# Patient Record
Sex: Male | Born: 1960 | Race: White | Hispanic: No | Marital: Married | Smoking: Never smoker
Health system: Southern US, Community
[De-identification: ages and names within clinical notes are randomized; demographics above are authoritative.]

## PROBLEM LIST (undated history)

## (undated) DIAGNOSIS — G4700 Insomnia, unspecified: Secondary | ICD-10-CM

## (undated) DIAGNOSIS — M2550 Pain in unspecified joint: Secondary | ICD-10-CM

## (undated) DIAGNOSIS — Z8639 Personal history of other endocrine, nutritional and metabolic disease: Secondary | ICD-10-CM

## (undated) DIAGNOSIS — F429 Obsessive-compulsive disorder, unspecified: Secondary | ICD-10-CM

## (undated) DIAGNOSIS — H5210 Myopia, unspecified eye: Secondary | ICD-10-CM

## (undated) DIAGNOSIS — E079 Disorder of thyroid, unspecified: Secondary | ICD-10-CM

## (undated) HISTORY — DX: Pain in unspecified joint: M25.50

## (undated) HISTORY — DX: Myopia, unspecified eye: H52.10

## (undated) HISTORY — DX: Obsessive-compulsive disorder, unspecified: F42.9

## (undated) HISTORY — DX: Personal history of other endocrine, nutritional and metabolic disease: Z86.39

## (undated) HISTORY — DX: Insomnia, unspecified: G47.00

---

## 2007-06-13 ENCOUNTER — Ambulatory Visit: Payer: Self-pay | Admitting: Family Medicine

## 2010-06-26 ENCOUNTER — Encounter
Admission: RE | Admit: 2010-06-26 | Discharge: 2010-06-26 | Payer: Self-pay | Source: Home / Self Care | Attending: Family Medicine | Admitting: Family Medicine

## 2010-06-26 ENCOUNTER — Ambulatory Visit
Admission: RE | Admit: 2010-06-26 | Discharge: 2010-06-26 | Payer: Self-pay | Source: Home / Self Care | Attending: Family Medicine | Admitting: Family Medicine

## 2010-11-27 ENCOUNTER — Encounter: Payer: Self-pay | Admitting: Medical

## 2010-11-27 ENCOUNTER — Ambulatory Visit (INDEPENDENT_AMBULATORY_CARE_PROVIDER_SITE_OTHER): Payer: BC Managed Care – PPO | Admitting: Medical

## 2010-11-27 VITALS — BP 128/88 | HR 80 | Temp 98.5°F | Ht 70.0 in | Wt 236.0 lb

## 2010-11-27 DIAGNOSIS — J40 Bronchitis, not specified as acute or chronic: Secondary | ICD-10-CM

## 2010-11-27 MED ORDER — AZITHROMYCIN 500 MG PO TABS: 500.0000 mg | ORAL_TABLET | Freq: Every day | ORAL | Status: AC

## 2010-11-27 NOTE — Patient Instructions (Signed)
Acute Bronchitis Bronchitis is a problem of the air tubes leading to your lungs. Acute means the illness started quickly. In this condition, the lining of those tubes becomes puffy (swollen) and can leak fluid. This makes it harder for air to get in and out of your lungs. You may cough a lot. This is because the air tubes are narrow. Bronchitis is most often caused by a virus. Medicines that kill germs (antibiotics) may be needed with germ (bacteria) infections for people who:  Smoke.   Have lasting (chronic) lung problems.   Are elderly.  HOME CARE  Rest.   Drink enough water and fluids to keep the pee clear or pale yellow.   Only take medicine as told by your doctor.   Medicines may be prescribed that will open up the airways. This will help make breathing easier.   Bronchitis usually gets better on its own in a few days.  Recovery from some problems (symptoms) of bronchitis may be slow. You should start feeling a little better after 2 to 3 days. Coughing may last for 3 to 4 weeks. GET HELP RIGHT AWAY IF:  You or your child has a temperature by mouth above 101, not controlled by medicine.   Chills or chest pain develops.   You or your child develops very bad shortness of breath.   There is bloody saliva mixed with mucus (sputum).   You or your child throws up (vomits) often, loses too much fluid (dehydration), feels faint, or has a very bad headache.   You or your child does not improve after 1 week of treatment.  MAKE SURE YOU:   Understand these instructions.   Will watch this condition.   Will get help right away if you or your child is not doing well or gets worse.  Document Released: 11/24/2007 Document Re-Released: 09/01/2009 [employer] Patient Information 2011 [employer].  

## 2010-11-27 NOTE — Progress Notes (Signed)
Subjective:     Bill Kennedy is a 50 y.o. male who presents for evaluation of eye irritation, nasal congestion, productive cough and sore throat.  Onset of symptoms was 4 days ago, and has been gradually worsening since that time. Treatment to date: Advil.  They  note hx/o bronchitis in the past.  Denies sick contacts.  No other aggravating or relieving factors.  He is a nonsmoker.    He is treated by Dr. Evelene Croon for OCD, but has ongoing issues with sleep.  Will sleep for 15 hours one day, and can go 3 days without sleep.  Has discussed this with Dr. Evelene Croon.  Denies hx/o bipolar or narcolepsy.  Wants to come in soon for physical.   The following portions of the patient's history were reviewed and updated as appropriate: allergies, current medications, past family history, past medical history, past social history, past surgical history and problem list.  Review of Systems Constitutional: Denies fever, chills, sweats, anorexia Skin: denies rash Cardiovascular: denies chest pain, palpitations Lungs: +productive sputum; denies wheezing, hemoptysis, orthopnea, PND Abdomen: denies abdominal pain, nausea, vomiting, diarrhea GU: denies dysuria Extremities: denies edema, myalgias, arthralgias  Objective:   Filed Vitals:   11/27/10 1353  BP: 128/88  Pulse: 80  Temp: 98.5 F (36.9 C)    General appearance: Alert, WD/WN, no distress, ill appearing                             Skin: warm, no rash, no diaphoresis                           Head: no sinus tenderness                            Eyes: conjunctiva normal, corneas clear, PERRLA                            Ears: pearly TMs, external ear canals normal                          Nose: septum midline, turbinates swollen, with erythema and clear discharge             Mouth/throat: MMM, tongue normal, mild pharyngeal erythema                           Neck: supple, no adenopathy, no thyromegaly, non tender                          Heart: RRR,  normal S1, S2, no murmurs                         Lungs: +bronchial breath sounds, +scattered rhonchi, no wheezes, no rales                Extremities: no edema, nontender     Assessment:   Encounter Diagnosis  Name Primary?  . Bronchitis Yes     Plan:   Prescription given today for Azithromycin as below.  Discussed diagnosis and treatment of bronchitis.  Suggested symptomatic OTC remedies for cough and congestion.  Nasal saline spray for nasal congestion.  Tylenol or Ibuprofen OTC for fever and malaise.  Call/return in 2-3 days if symptoms are worse or not improving.  Advised that cough may linger even after the infection is improved.     Return soon for physical, labs, and to further discuss his sleep and concerns.

## 2011-06-10 ENCOUNTER — Ambulatory Visit: Payer: BC Managed Care – PPO | Admitting: Family Medicine

## 2011-06-11 ENCOUNTER — Ambulatory Visit (INDEPENDENT_AMBULATORY_CARE_PROVIDER_SITE_OTHER): Payer: BC Managed Care – PPO | Admitting: Family Medicine

## 2011-06-11 VITALS — BP 120/88 | HR 89 | Temp 98.6°F | Wt 250.0 lb

## 2011-06-11 DIAGNOSIS — J069 Acute upper respiratory infection, unspecified: Secondary | ICD-10-CM

## 2011-06-11 NOTE — Progress Notes (Signed)
  Subjective:    Patient ID: Bill Kennedy, male    DOB: 06-Feb-1961, 50 y.o.   MRN: [id number]  HPI Three days ago he started having difficulty with runny nose, chest congestion, headache with sinus pressure, fatigue with PND,dry cough.   Review of Systems     Objective:   Physical Exam alert and in no distress. Tympanic membranes and canals are normal. Throat is clear. Tonsils are normal. Neck is supple without adenopathy or thyromegaly. Cardiac exam shows a regular sinus rhythm without murmurs or gallops. Lungs are clear to auscultation.        Assessment & Plan:  URI Supportive care. Use Afrin at night to help with breathing through the nose. If not improving by Monday call for possible antibiotic.

## 2011-06-11 NOTE — Patient Instructions (Signed)
Use Robitussin-DM or try NyQuil at night. Also use for nighttime. If he still has symptoms or U. getting worse by Monday call back

## 2011-06-17 ENCOUNTER — Telehealth: Payer: Self-pay | Admitting: Family Medicine

## 2011-06-17 MED ORDER — AMOXICILLIN 875 MG PO TABS: 875.0000 mg | ORAL_TABLET | Freq: Two times a day (BID) | ORAL | Status: AC

## 2011-06-17 NOTE — Telephone Encounter (Signed)
Amoxil called in to treat ongoing difficulty with bronchitis

## 2011-06-17 NOTE — Telephone Encounter (Signed)
Pt called and stated that in some ways he is better and other ways worse.  Pt states cough is worse and just to note he was coughing rather badly while on the phone.  States he is now coughing up stuff and has sinus drainage.  Pt uses [facility] on [address].

## 2011-06-17 NOTE — Telephone Encounter (Signed)
Call the  patient and let him know I called in an antibiotic in

## 2011-06-18 NOTE — Telephone Encounter (Signed)
Bill Kennedy spoke with pt this morning and advised Amoxil was called in.

## 2011-07-20 ENCOUNTER — Encounter: Payer: Self-pay | Admitting: Medical

## 2011-07-20 ENCOUNTER — Ambulatory Visit (INDEPENDENT_AMBULATORY_CARE_PROVIDER_SITE_OTHER): Payer: BC Managed Care – PPO | Admitting: Medical

## 2011-07-20 VITALS — BP 130/82 | HR 78 | Temp 98.3°F | Resp 18 | Ht 69.0 in | Wt 243.0 lb

## 2011-07-20 DIAGNOSIS — Z125 Encounter for screening for malignant neoplasm of prostate: Secondary | ICD-10-CM

## 2011-07-20 DIAGNOSIS — R5382 Chronic fatigue, unspecified: Secondary | ICD-10-CM | POA: Insufficient documentation

## 2011-07-20 DIAGNOSIS — Z1211 Encounter for screening for malignant neoplasm of colon: Secondary | ICD-10-CM

## 2011-07-20 DIAGNOSIS — R5381 Other malaise: Secondary | ICD-10-CM

## 2011-07-20 DIAGNOSIS — L570 Actinic keratosis: Secondary | ICD-10-CM

## 2011-07-20 DIAGNOSIS — R5383 Other fatigue: Secondary | ICD-10-CM

## 2011-07-20 DIAGNOSIS — Z23 Encounter for immunization: Secondary | ICD-10-CM

## 2011-07-20 DIAGNOSIS — Z0000 Encounter for general adult medical examination without abnormal findings: Secondary | ICD-10-CM

## 2011-07-20 DIAGNOSIS — R6882 Decreased libido: Secondary | ICD-10-CM

## 2011-07-20 LAB — POCT URINALYSIS DIPSTICK
Blood, UA: NEGATIVE
Glucose, UA: NEGATIVE
Protein, UA: NEGATIVE
Spec Grav, UA: <=1.005
Urobilinogen, UA: NEGATIVE
pH, UA: 6

## 2011-07-20 NOTE — Progress Notes (Signed)
Subjective:   HPI  Bill Kennedy is a 51 y.o. male who presents for a complete physical.  He is fasting today.  He does have some recent concerns. Lately he has just been feeling fatigued all the time, no energy. He has some joint pains in her shoulders, has a history of injuries in the past.  He is curious about having his hormones checked and some labs given the fatigue.  He is followed by psychiatry, Dr. Evelene Croon and Wellbutrin was added on about a week ago.  He notes that he does snore some, but no history of apnea, and has never been tested for sleep apnea.  Last eye doctor visit last year, last dental visit about 6months ago.   He did not get flu shot this past year. Last tetanus probably more than 10 years ago.  Denies prior colonoscopy.    Reviewed their medical, surgical, family, social, medication, and allergy history and updated chart as appropriate.    Past Medical History  Diagnosis Date  . OCD (obsessive compulsive disorder)     Dr. Evelene Croon  . Insomnia   . Personal history of goiter   . Joint pain     hx/o bone spur of right AC joint  . Nearsightedness     wears glasses  . Hematuria     hospitalization in remote past, etiology unknown, and symptoms cleared    History reviewed. No pertinent past surgical history.  Family History  Problem Relation Age of Onset  . Diabetes Mother   . Macular degeneration Mother   . Hypertension Mother   . Other Father     unknown  . Anxiety disorder Sister   . Anxiety disorder Brother   . Heart disease Neg Hx   . Cancer Neg Hx   . Stroke Neg Hx   . Hyperlipidemia Neg Hx     History   Social History  . Marital Status: Married    Spouse Name: N/A    Number of Children: N/A  . Years of Education: N/A   Occupational History  . manual body therapy    Social History Main Topics  . Smoking status: Never Smoker   . Smokeless tobacco: Never Used  . Alcohol Use: 0.5 oz/week    1 drink(s) per week  . Drug Use: No  . Sexually  Active: Not on file   Other Topics Concern  . Not on file   Social History Narrative   Married, 2 children, exercise - rides mountain bike, yoga    Current Outpatient Prescriptions on File Prior to Visit  Medication Sig Dispense Refill  . ALPRAZolam (XANAX) 0.5 MG tablet Take 0.5 mg by mouth at bedtime as needed.        . Fluvoxamine Maleate (LUVOX CR) 150 MG CP24 Take 1 capsule by mouth daily.        . Zolpidem Tartrate (AMBIEN PO) Take by mouth.          No Known Allergies  Review of Systems Constitutional: -fever, -chills, -sweats, -unexpected weight change, -anorexia, +fatigue Allergy: -sneezing, -itching, -congestion Dermatology: denies changing moles, rash, lumps, new worrisome lesions ENT: -runny nose, -ear pain, -sore throat, -hoarseness, -sinus pain, -teeth pain, -tinnitus, -hearing loss, -epistaxis Cardiology:  -chest pain, -palpitations, -edema, -orthopnea, -paroxysmal nocturnal dyspnea Respiratory: -cough, -shortness of breath, -dyspnea on exertion, -wheezing, -hemoptysis Gastroenterology: -abdominal pain, -nausea, -vomiting, -diarrhea, -constipation, -blood in stool, -changes in bowel movement, -dysphagia Hematology: -bleeding or bruising problems Musculoskeletal: -arthralgias, -myalgias, -joint swelling, -back  pain, +neck pain, -cramping, -gait changes Ophthalmology: -vision changes, -eye redness, -itching, -discharge Urology: -dysuria, -difficulty urinating, -hematuria, -urinary frequency, -urgency, incontinence Neurology: -headache, -weakness, -tingling, -numbness, -speech abnormality, -memory loss, -falls, -dizziness Psychology:  -depressed mood, -agitation, +sleep problems     Objective:   Physical Exam  Filed Vitals:   07/20/11 0937  BP: 130/82  Pulse: 78  Temp: 98.3 F (36.8 C)  Resp: 18    General appearance: alert, no distress, WD/WN, white male, overweight Skin: Upper middle chest with 2 mm raised crusted hard lesion slightly inflamed, several  scattered flesh-colored slightly raised crusting lesions on right arm, left anterior leg, all suggestive of AKs, left upper back with oval somewhat elongated 2 mm 4 mm brown and somewhat crusted flesh-colored lesion , other scattered benign appearing lesions HEENT: normocephalic, conjunctiva/corneas normal, sclerae anicteric, PERRLA, EOMi, nares patent, no discharge or erythema, pharynx normal Oral cavity: MMM, tongue normal, teeth in good repair Neck: supple, no lymphadenopathy, no thyromegaly, no masses, normal ROM, no bruits Chest: non tender, normal shape and expansion Heart: RRR, normal S1, S2, no murmurs Lungs: CTA bilaterally, no wheezes, rhonchi, or rales Abdomen: +bs, soft, non tender, non distended, no masses, no hepatomegaly, no splenomegaly, no bruits Back: non tender, normal ROM, no scoliosis Musculoskeletal: upper extremities non tender, no obvious deformity, normal ROM throughout, lower extremities non tender, no obvious deformity, normal ROM throughout Extremities: no edema, no cyanosis, no clubbing Pulses: 2+ symmetric, upper and lower extremities, normal cap refill Neurological: alert, oriented x 3, CN2-12 intact, strength normal upper extremities and lower extremities, sensation normal throughout, DTRs 2+ throughout, no cerebellar signs, gait normal Psychiatric: normal affect, behavior normal, pleasant  GU: normal male external genitalia, nontender, no masses, no hernia, no lymphadenopathy Rectal: Anus normal, prostate smooth, no nodules, guaiac negative, no obvious hemorrhoids   Assessment and Plan :    Encounter Diagnoses  Name Primary?  . Routine general medical examination at a health care facility Yes  . Fatigue   . Libido, decreased   . Screen for colon cancer   . Screening for prostate cancer   . Actinic keratoses   . Need for Tdap vaccination     Physical exam - discussed healthy lifestyle, diet, exercise, preventative care, vaccinations, and addressed their  concerns.  We had discussed him having a Tdap booster today but he left before we had a chance to give the vaccine.  Labs today to further evaluate fatigue and libido.  We'll refer for first screening colonoscopy with Dr. Elnoria Howard.  PSA screening today   AKs - advised dermatology referral for further eval and management.  Follow-up pending labs.

## 2011-07-20 NOTE — Patient Instructions (Signed)
Preventative Care for Adults, Male       REGULAR HEALTH EXAMS:  A routine yearly physical is a good way to check in with your primary care provider about your health and preventive screening. It is also an opportunity to share updates about your health and any concerns you have, and receive a thorough all-over exam.   Most health insurance companies pay for at least some preventative services.  Check with your health plan for specific coverages.  WHAT PREVENTATIVE SERVICES DO MEN NEED?  Adult men should have their weight and blood pressure checked regularly.   Men age 35 and older should have their cholesterol levels checked regularly.  Beginning at age 50 and continuing to age 75, men should be screened for colorectal cancer.  Certain people should may need continued testing until age 85.  Other cancer screening may include exams for testicular and prostate cancer.  Updating vaccinations is part of preventative care.  Vaccinations help protect against diseases such as the flu.  Lab tests are generally done as part of preventative care to screen for anemia and blood disorders, to screen for problems with the kidneys and liver, to screen for bladder problems, to check blood sugar, and to check your cholesterol level.  Preventative services generally include counseling about diet, exercise, avoiding tobacco, drugs, excessive alcohol consumption, and sexually transmitted infections.    GENERAL RECOMMENDATIONS FOR GOOD HEALTH:  Healthy diet:  Eat a variety of foods, including fruit, vegetables, animal or vegetable protein, such as meat, fish, chicken, and eggs, or beans, lentils, tofu, and grains, such as rice.  Drink plenty of water daily.  Decrease saturated fat in the diet, avoid lots of red meat, processed foods, sweets, fast foods, and fried foods.  Exercise:  Aerobic exercise helps maintain good heart health. At least 30-40 minutes of moderate-intensity exercise is recommended.  For example, a brisk walk that increases your heart rate and breathing. This should be done on most days of the week.   Find a type of exercise or a variety of exercises that you enjoy so that it becomes a part of your daily life.  Examples are running, walking, swimming, water aerobics, and biking.  For motivation and support, explore group exercise such as aerobic class, spin class, Zumba, Yoga,or  martial arts, etc.    Set exercise goals for yourself, such as a certain weight goal, walk or run in a race such as a 5k walk/run.  Speak to your primary care provider about exercise goals.  Disease prevention:  If you smoke or chew tobacco, find out from your caregiver how to quit. It can literally save your life, no matter how long you have been a tobacco user. If you do not use tobacco, never begin.   Maintain a healthy diet and normal weight. Increased weight leads to problems with blood pressure and diabetes.   The Body Mass Index or BMI is a way of measuring how much of your body is fat. Having a BMI above 27 increases the risk of heart disease, diabetes, hypertension, stroke and other problems related to obesity. Your caregiver can help determine your BMI and based on it develop an exercise and dietary program to help you achieve or maintain this important measurement at a healthful level.  High blood pressure causes heart and blood vessel problems.  Persistent high blood pressure should be treated with medicine if weight loss and exercise do not work.   Fat and cholesterol leaves deposits in your arteries   that can block them. This causes heart disease and vessel disease elsewhere in your body.  If your cholesterol is found to be high, or if you have heart disease or certain other medical conditions, then you may need to have your cholesterol monitored frequently and be treated with medication.   Ask if you should have a stress test if your history suggests this. A stress test is a test done on  a treadmill that looks for heart disease. This test can find disease prior to there being a problem.  Avoid drinking alcohol in excess (more than two drinks per day).  Avoid use of street drugs. Do not share needles with anyone. Ask for professional help if you need assistance or instructions on stopping the use of alcohol, cigarettes, and/or drugs.  Brush your teeth twice a day with fluoride toothpaste, and floss once a day. Good oral hygiene prevents tooth decay and gum disease. The problems can be painful, unattractive, and can cause other health problems. Visit your dentist for a routine oral and dental check up and preventive care every 6-12 months.   Look at your skin regularly.  Use a mirror to look at your back. Notify your caregivers of changes in moles, especially if there are changes in shapes, colors, a size larger than a pencil eraser, an irregular border, or development of new moles.  Safety:  Use seatbelts 100% of the time, whether driving or as a passenger.  Use safety devices such as hearing protection if you work in environments with loud noise or significant background noise.  Use safety glasses when doing any work that could send debris in to the eyes.  Use a helmet if you ride a bike or motorcycle.  Use appropriate safety gear for contact sports.  Talk to your caregiver about gun safety.  Use sunscreen with a SPF (or skin protection factor) of 15 or greater.  Lighter skinned people are at a greater risk of skin cancer. Don't forget to also wear sunglasses in order to protect your eyes from too much damaging sunlight. Damaging sunlight can accelerate cataract formation.   Practice safe sex. Use condoms. Condoms are used for birth control and to help reduce the spread of sexually transmitted infections (or STIs).  Some of the STIs are gonorrhea (the clap), chlamydia, syphilis, trichomonas, herpes, HPV (human papilloma virus) and HIV (human immunodeficiency virus) which causes AIDS.  The herpes, HIV and HPV are viral illnesses that have no cure. These can result in disability, cancer and death.   Keep carbon monoxide and smoke detectors in your home functioning at all times. Change the batteries every 6 months or use a model that plugs into the wall.   Vaccinations:  Stay up to date with your tetanus shots and other required immunizations. You should have a booster for tetanus every 10 years. Be sure to get your flu shot every year, since 5%-20% of the [place] population comes down with the flu. The flu vaccine changes each year, so being vaccinated once is not enough. Get your shot in the fall, before the flu season peaks.   Other vaccines to consider:  Pneumococcal vaccine to protect against certain types of pneumonia.  This is normally recommended for adults age 65 or older.  However, adults younger than 51 years old with certain underlying conditions such as diabetes, heart or lung disease should also receive the vaccine.  Shingles vaccine to protect against Varicella Zoster if you are older than age 60, or younger   than 51 years old with certain underlying illness.  Hepatitis A vaccine to protect against a form of infection of the liver by a virus acquired from food.  Hepatitis B vaccine to protect against a form of infection of the liver by a virus acquired from blood or body fluids, particularly if you work in health care.  If you plan to travel internationally, check with your local health department for specific vaccination recommendations.  Cancer Screening:  Most routine colon cancer screening begins at the age of 50. On a yearly basis, doctors may provide special easy to use take-home tests to check for hidden blood in the stool. Sigmoidoscopy or colonoscopy can detect the earliest forms of colon cancer and is life saving. These tests use a small camera at the end of a tube to directly examine the colon. Speak to your caregiver about this at age 50, when routine  screening begins (and is repeated every 5 years unless early forms of pre-cancerous polyps or small growths are found).   At the age of 50 men usually start screening for prostate cancer every year. Screening may begin at a younger age for those with higher risk. Those at higher risk include African-Americans or having a family history of prostate cancer. There are two types of tests for prostate cancer:   Prostate-specific antigen (PSA) testing. Recent studies raise questions about prostate cancer using PSA and you should discuss this with your caregiver.   Digital rectal exam (in which your doctor's lubricated and gloved finger feels for enlargement of the prostate through the anus).   Screening for testicular cancer.  Do a monthly exam of your testicles. Gently roll each testicle between your thumb and fingers, feeling for any abnormal lumps. The best time to do this is after a hot shower or bath when the tissues are looser. Notify your caregivers of any lumps, tenderness or changes in size or shape immediately.     

## 2011-07-21 ENCOUNTER — Encounter: Payer: Self-pay | Admitting: Medical

## 2011-07-21 LAB — CBC WITH DIFFERENTIAL/PLATELET
HCT: 48.4 % (ref 39.0–52.0)
Hemoglobin: 16.6 g/dL (ref 13.0–17.0)
Lymphs Abs: 1.7 10*3/uL (ref 0.7–4.0)
MCH: 31.4 pg (ref 26.0–34.0)
Monocytes Relative: 7 % (ref 3–12)
Neutro Abs: 5.2 10*3/uL (ref 1.7–7.7)
Neutrophils Relative %: 69 % (ref 43–77)
RBC: 5.29 MIL/uL (ref 4.22–5.81)

## 2011-07-21 LAB — COMPREHENSIVE METABOLIC PANEL
Albumin: 5 g/dL (ref 3.5–5.2)
Alkaline Phosphatase: 59 U/L (ref 39–117)
BUN: 12 mg/dL (ref 6–23)
Calcium: 10 mg/dL (ref 8.4–10.5)
Creat: 1 mg/dL (ref 0.50–1.35)
Glucose, Bld: 101 mg/dL — ABNORMAL HIGH (ref 70–99)
Potassium: 4 mEq/L (ref 3.5–5.3)

## 2011-07-21 LAB — LIPID PANEL
Cholesterol: 239 mg/dL — ABNORMAL HIGH (ref 0–200)
Total CHOL/HDL Ratio: 6.8 Ratio
Triglycerides: 274 mg/dL — ABNORMAL HIGH (ref <150)
VLDL: 55 mg/dL — ABNORMAL HIGH (ref 0–40)

## 2018-10-13 ENCOUNTER — Other Ambulatory Visit (INDEPENDENT_AMBULATORY_CARE_PROVIDER_SITE_OTHER): Payer: Self-pay

## 2018-10-13 ENCOUNTER — Ambulatory Visit (INDEPENDENT_AMBULATORY_CARE_PROVIDER_SITE_OTHER): Payer: Self-pay

## 2018-10-13 ENCOUNTER — Encounter (INDEPENDENT_AMBULATORY_CARE_PROVIDER_SITE_OTHER): Payer: Self-pay | Admitting: Family Medicine

## 2018-10-13 ENCOUNTER — Ambulatory Visit (INDEPENDENT_AMBULATORY_CARE_PROVIDER_SITE_OTHER): Payer: BLUE CROSS/BLUE SHIELD | Admitting: Family Medicine

## 2018-10-13 ENCOUNTER — Other Ambulatory Visit: Payer: Self-pay

## 2018-10-13 VITALS — BP 127/79 | HR 73 | Temp 98.6°F | Ht 70.0 in | Wt 235.9 lb

## 2018-10-13 DIAGNOSIS — M25561 Pain in right knee: Secondary | ICD-10-CM | POA: Diagnosis not present

## 2018-10-13 DIAGNOSIS — G8929 Other chronic pain: Secondary | ICD-10-CM | POA: Insufficient documentation

## 2018-10-13 DIAGNOSIS — M4306 Spondylolysis, lumbar region: Secondary | ICD-10-CM | POA: Diagnosis not present

## 2018-10-13 DIAGNOSIS — E8881 Metabolic syndrome: Secondary | ICD-10-CM

## 2018-10-13 DIAGNOSIS — E349 Endocrine disorder, unspecified: Secondary | ICD-10-CM

## 2018-10-13 DIAGNOSIS — M25511 Pain in right shoulder: Secondary | ICD-10-CM

## 2018-10-13 DIAGNOSIS — M25512 Pain in left shoulder: Secondary | ICD-10-CM

## 2018-10-13 DIAGNOSIS — M25562 Pain in left knee: Secondary | ICD-10-CM

## 2018-10-13 DIAGNOSIS — Z720 Tobacco use: Secondary | ICD-10-CM

## 2018-10-13 DIAGNOSIS — E291 Testicular hypofunction: Secondary | ICD-10-CM

## 2018-10-13 DIAGNOSIS — R5382 Chronic fatigue, unspecified: Secondary | ICD-10-CM | POA: Diagnosis not present

## 2018-10-13 DIAGNOSIS — E039 Hypothyroidism, unspecified: Secondary | ICD-10-CM

## 2018-10-13 DIAGNOSIS — E785 Hyperlipidemia, unspecified: Secondary | ICD-10-CM | POA: Insufficient documentation

## 2018-10-13 DIAGNOSIS — E669 Obesity, unspecified: Secondary | ICD-10-CM | POA: Insufficient documentation

## 2018-10-13 NOTE — Progress Notes (Signed)
Office Visit Note   Patient: Bill Kennedy           Date of Birth: Nov 20, 1960           MRN: [id number] Visit Date: 10/13/2018 Requested by: Ronnald Nian, MD [address] [place], [place] [address] PCP: Lavada Mesi, MD  Subjective: Chief Complaint  Patient presents with  . establish primary care    HPI: He is a 58 year old here to establish care.  He was referred by 1 of my patients.  He has multiple issues to discuss today.  In his 84s he started having troubles with fatigue and multiple joint aches and pains.  He was in the [employer] and wonders whether his symptoms might have started after getting a bunch of vaccines.  He has gotten steadily worse over the years and he eventually sought treatment from a naturopathic doctor in [place].  This finally helped him quite a bit.  That doctor unfortunately passed away, and he has been seeing another provider in [facility].  Labs a few years ago showed low testosterone which is now being treated with injectable testosterone 50 mg every week.  He takes Arimedex for elevated estradiol and periodically gets phlebotomy treatments when needed.  This regimen definitely helps his fatigue.  He was also started on Armour Thyroid a couple years ago despite his thyroid function tests being in normal range.  He felt dramatically better after starting this and he continues on it now.  He has a history of hyperlipidemia with abnormal lipid particle sizes.  He had a cardiac work-up about 5 or 6 years ago which was negative.  He denies any chest pain or palpitations but he was having some palpitations at that time.  No definite family history of cardiac disease but his father's history is unknown.  Patient does have a history of borderline elevated blood sugars.  His mother is diabetic.  He has tried to eat healthfully over the years.  He has not tried any food elimination diet, but a few months ago he got frustrated with his lipid numbers not  improving with dietary changes so he went for 2 weeks eating only meat and he felt remarkably good on this regimen.  He has chronic pain in both shoulders and both knees related to multiple injuries when he was younger.  He was also diagnosed with lumbar spondylolisthesis many years ago.  His joints get stiff and achy frequently.  He would like to have x-rays of all of these areas.  He denies any sciatica symptoms.  While in the [employer] he developed gross hematuria which lasted for several months.  He underwent extensive testing and no cause was found for his problem.  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  He has not had any hematuria issues since then.  He has chronic insomnia but is managing it fairly well.  He gets occasional anxiety but rarely needs to take anything for that.               ROS: Denies any headaches, fevers, chills, night sweats, unintentional weight change.  Denies any gastrointestinal troubles.  He has not had any skin/hair/nail changes.  All other systems were reviewed and are negative.  Objective: Vital Signs: BP 127/79 (BP Location: Left Arm, Patient Position: Sitting, Cuff Size: Normal)   Pulse 73   Temp 98.6 F (37 C)   Ht  (1.778 m)   Wt 235 lb 14.3 oz (107 kg)  BMI 33.85 kg/m   Physical Exam:  General:  Alert and oriented, in no acute distress. Pulm:  Breathing unlabored. Psy:  Normal mood, congruent affect. Skin: No abnormalities seen. HEENT: He has no dental fillings but he has had an implant in a right upper molar and left lower molar.  No lymphadenopathy in his neck.  No thyromegaly or nodules.  No carotid bruits. CV: Regular rate and rhythm without murmurs, rubs, or gallops.  No peripheral edema.  2+ radial and posterior tibial pulses. Lungs: Clear to auscultation throughout with no wheezing or areas of consolidation. Abd: Bowel sounds are active, no hepatosplenomegaly or masses.  Soft and nontender.  No audible bruits.  No evidence  of ascites. Extremities: Full range of motion of both shoulders and knees, no warmth or erythema in his joints. Low back: No rash on the skin.  Good range of motion, normal reflexes and strength in the lower extremities.   Imaging: X-rays both shoulders: Right greater than left AC joint arthropathy.  Early glenohumeral spurring on the right.  Overall, shoulder looks pretty good.  X-rays both knees: Possibly some very early spurring in the patellofemoral joint on both knees but tibiofemoral joints look good.  X-rays lumbar spine: L5 spondylolysis with no spondylolisthesis.  He has diffuse mild degenerative disc disease.  Left hip joint incompletely visualized but it appears he has mild to moderate arthritis.    Assessment & Plan: 1.  Chronic fatigue, probably multifactorial.  Doing fairly well on current regimen. -We will try a food elimination diet.  2.  Bilateral chronic knee pain  3.  Bilateral chronic shoulder pain  4.  Lumbar spondylolysis  5.  Testosterone deficiency, doing better on injectable testosterone.  6.  Hyperlipidemia - We will order a CT calcium score to better assess cardiac risk. - Labs per Theodora Blow, NP.  7.  Obesity - Keep working on healthy lifestyle.  Trial of food elimination diet.  Time spent with patient was 80 minutes, with greater than 50% of the time spent in face-to-face counseling and coordination of care.      Procedures: No procedures performed  No notes on file     PMFS History: Patient Active Problem List   Diagnosis Date Noted  . Testosterone deficiency 10/13/2018  . Hyperlipidemia 10/13/2018  . Obesity (BMI 30.0-34.9) 10/13/2018  . Routine general medical examination at a health care facility 07/20/2011  . Chronic fatigue 07/20/2011  . Libido, decreased 07/20/2011  . Screen for colon cancer 07/20/2011  . Screening for prostate cancer 07/20/2011  . Need for Tdap vaccination 07/20/2011  . Actinic keratoses 07/20/2011    Past Medical History:  Diagnosis Date  . Hematuria    hospitalization in remote past, etiology unknown, and symptoms cleared  . Insomnia   . Joint pain    hx/o bone spur of right AC joint  . Nearsightedness    wears glasses  . OCD (obsessive compulsive disorder)    Dr. Evelene Croon  . Personal history of goiter     Family History  Problem Relation Age of Onset  . Diabetes Mother   . Macular degeneration Mother   . Hypertension Mother   . Other Father        unknown  . Anxiety disorder Sister   . Anxiety disorder Brother   . Heart disease Neg Hx   . Cancer Neg Hx   . Stroke Neg Hx   . Hyperlipidemia Neg Hx     History reviewed. No pertinent surgical  history. Social History   Occupational History  . Occupation: manual body therapy    Employer: [employer]  Tobacco Use  . Smoking status: Never Smoker  . Smokeless tobacco: Never Used  Substance and Sexual Activity  . Alcohol use: Yes    Alcohol/week: 1.0 standard drinks    Types: 1 drink(s) per week  . Drug use: No  . Sexual activity: Not on file

## 2018-10-17 ENCOUNTER — Telehealth (INDEPENDENT_AMBULATORY_CARE_PROVIDER_SITE_OTHER): Payer: Self-pay | Admitting: Family Medicine

## 2018-10-17 NOTE — Telephone Encounter (Signed)
1 more lab is still pending.  Testosterone levels look perfect.  Insulin level and hemoglobin A1c look good, but blood glucose remains in prediabetes range at 100.  Lipids are elevated again, and it is concerning that triglycerides are 188 relative to HDL of 46.  When the triglycerides are more than doubled the HDL, there is a higher likelihood of becoming diabetic.  It is very important to maintain a regular exercise regimen and to minimize dietary intake of processed carbohydrates including breads, pastas, cereals, sugars and sweets.  We should recheck in about 6 months.  CBC looks okay with hemoglobin of 17.7 and hematocrit of 51.8.  We should monitor this in 2 to 3 months.  Others look good.

## 2018-10-20 LAB — PROGESTERONE: Progesterone: <0.5 ng/mL (ref <1.4)

## 2018-10-20 LAB — LIPID PANEL
Cholesterol: 267 mg/dL — ABNORMAL HIGH (ref <200)
HDL: 46 mg/dL (ref >=40)
LDL Cholesterol (Calc): 185 mg/dL (calc) — ABNORMAL HIGH
Non-HDL Cholesterol (Calc): 221 mg/dL (calc) — ABNORMAL HIGH (ref <130)
Total CHOL/HDL Ratio: 5.8 (calc) — ABNORMAL HIGH (ref <5.0)
Triglycerides: 188 mg/dL — ABNORMAL HIGH (ref <150)

## 2018-10-20 LAB — CBC WITH DIFFERENTIAL/PLATELET
Absolute Monocytes: 540 cells/uL (ref 200–950)
Basophils Absolute: 79 cells/uL (ref 0–200)
Basophils Relative: 1.1 %
Eosinophils Absolute: 108 cells/uL (ref 15–500)
Eosinophils Relative: 1.5 %
HCT: 51.8 % — ABNORMAL HIGH (ref 38.5–50.0)
Hemoglobin: 17.7 g/dL — ABNORMAL HIGH (ref 13.2–17.1)
Lymphs Abs: 1634 cells/uL (ref 850–3900)
MCH: 31.6 pg (ref 27.0–33.0)
MCHC: 34.2 g/dL (ref 32.0–36.0)
MCV: 92.5 fL (ref 80.0–100.0)
MPV: 11.9 fL (ref 7.5–12.5)
Monocytes Relative: 7.5 %
Neutro Abs: 4838 cells/uL (ref 1500–7800)
Neutrophils Relative %: 67.2 %
Platelets: 184 10*3/uL (ref 140–400)
RBC: 5.6 10*6/uL (ref 4.20–5.80)
RDW: 12.9 % (ref 11.0–15.0)
Total Lymphocyte: 22.7 %
WBC: 7.2 10*3/uL (ref 3.8–10.8)

## 2018-10-20 LAB — INSULIN, FREE (BIOACTIVE): Insulin, Free: 5.9 u[IU]/mL (ref 1.5–14.9)

## 2018-10-20 LAB — COMPREHENSIVE METABOLIC PANEL
AG Ratio: 1.9 (calc) (ref 1.0–2.5)
ALT: 24 U/L (ref 9–46)
AST: 20 U/L (ref 10–35)
Albumin: 4.4 g/dL (ref 3.6–5.1)
Alkaline phosphatase (APISO): 53 U/L (ref 35–144)
BUN: 13 mg/dL (ref 7–25)
CO2: 29 mmol/L (ref 20–32)
Calcium: 10.2 mg/dL (ref 8.6–10.3)
Chloride: 103 mmol/L (ref 98–110)
Creat: 0.97 mg/dL (ref 0.70–1.33)
Globulin: 2.3 g/dL (calc) (ref 1.9–3.7)
Glucose, Bld: 100 mg/dL — ABNORMAL HIGH (ref 65–99)
Potassium: 5.1 mmol/L (ref 3.5–5.3)
Sodium: 140 mmol/L (ref 135–146)
Total Bilirubin: 0.9 mg/dL (ref 0.2–1.2)
Total Protein: 6.7 g/dL (ref 6.1–8.1)

## 2018-10-20 LAB — TESTOSTERONE TOTAL,FREE,BIO, MALES
Albumin: 4.4 g/dL (ref 3.6–5.1)
Sex Hormone Binding: 30 nmol/L (ref 22–77)
Testosterone, Bioavailable: 266.1 ng/dL (ref >=110.0)
Testosterone, Free: 132.2 pg/mL (ref 46.0–224.0)
Testosterone: 810 ng/dL (ref 250–827)

## 2018-10-20 LAB — ESTRADIOL: Estradiol: 21 pg/mL (ref <=39)

## 2018-10-20 LAB — ESTROGENS, TOTAL: Estrogen: 189.9 pg/mL (ref 60–190)

## 2018-10-20 LAB — HEMOGLOBIN A1C
Hgb A1c MFr Bld: 5.2 % of total Hgb (ref <5.7)
Mean Plasma Glucose: 103 (calc)
eAG (mmol/L): 5.7 (calc)

## 2018-10-20 LAB — PSA: PSA: 1.3 ng/mL (ref <=4.0)

## 2018-10-21 ENCOUNTER — Encounter: Payer: Self-pay | Admitting: Family Medicine

## 2018-12-16 ENCOUNTER — Encounter: Payer: Self-pay | Admitting: Family Medicine

## 2019-01-04 ENCOUNTER — Telehealth: Payer: Self-pay | Admitting: Family Medicine

## 2019-01-04 ENCOUNTER — Other Ambulatory Visit (INDEPENDENT_AMBULATORY_CARE_PROVIDER_SITE_OTHER): Payer: Self-pay | Admitting: Family Medicine

## 2019-01-04 DIAGNOSIS — E785 Hyperlipidemia, unspecified: Secondary | ICD-10-CM

## 2019-01-04 NOTE — Telephone Encounter (Signed)
Received call from [facility] with [facility] stating order need to be changed for CT scan. It need to show cardio instead of tobacco (lungs)    The number to contact Prentiss Bells is [phone]

## 2019-01-04 NOTE — Telephone Encounter (Signed)
DONE

## 2019-01-04 NOTE — Telephone Encounter (Signed)
Will you change the diagnosis on the cardiac CT order to E78.5 (hyperlipidemia)? The diagnosis on the original order does not match up correctly. I have spoken with Prentiss Bells about this already, but she said just give her a call if you have any questions or anything to add.

## 2019-01-04 NOTE — Addendum Note (Signed)
Addended by: Michae Kava B on: 01/04/2019 02:24 PM   Modules accepted: Orders

## 2019-01-04 NOTE — Telephone Encounter (Signed)
Left message for Bill Kennedy to call back with clarification on order reason change.

## 2019-01-18 ENCOUNTER — Encounter: Payer: Self-pay | Admitting: Family Medicine

## 2019-01-24 ENCOUNTER — Ambulatory Visit
Admission: RE | Admit: 2019-01-24 | Discharge: 2019-01-24 | Disposition: A | Discharge status: Home / Self Care | Payer: No Typology Code available for payment source | Source: Ambulatory Visit | Attending: Family Medicine | Admitting: Family Medicine

## 2019-01-24 DIAGNOSIS — E785 Hyperlipidemia, unspecified: Secondary | ICD-10-CM

## 2019-01-25 ENCOUNTER — Telehealth: Payer: Self-pay | Admitting: Family Medicine

## 2019-01-25 NOTE — Telephone Encounter (Signed)
I placed the order at the front desk for the patient to pick up.

## 2019-01-25 NOTE — Telephone Encounter (Signed)
CT calcium score was 76.

## 2019-01-30 ENCOUNTER — Other Ambulatory Visit (INDEPENDENT_AMBULATORY_CARE_PROVIDER_SITE_OTHER): Payer: Self-pay | Admitting: Family Medicine

## 2019-02-03 ENCOUNTER — Telehealth: Payer: Self-pay | Admitting: Family Medicine

## 2019-02-03 LAB — CARDIO IQ(R) ADVANCED LIPID PANEL
Apolipoprotein B: 135 mg/dL — ABNORMAL HIGH
Cholesterol: 222 mg/dL — ABNORMAL HIGH
HDL: 37 mg/dL — ABNORMAL LOW
LDL Cholesterol (Calc): 159 mg/dL — ABNORMAL HIGH
LDL Large: 4165 nmol/L — ABNORMAL LOW
LDL Medium: 422 nmol/L — ABNORMAL HIGH
LDL Particle Number: 2045 nmol/L — ABNORMAL HIGH
LDL Peak Size: 209.7 Angstrom — ABNORMAL LOW
LDL Small: 589 nmol/L — ABNORMAL HIGH
Lipoprotein (a): 70 nmol/L
Non-HDL Cholesterol (Calc): 185 mg/dL — ABNORMAL HIGH
Total CHOL/HDL Ratio: 6 calc — ABNORMAL HIGH
Triglycerides: 135 mg/dL

## 2019-02-03 LAB — CARDIO IQ LP-PLA2 ACTIVITY: PLAC: 186 nmol/min/mL — ABNORMAL HIGH (ref <=123)

## 2019-02-03 LAB — CARDIO IQ (R) HS-CRP: hs-CRP: 0.8 mg/L

## 2019-02-03 NOTE — Telephone Encounter (Signed)
Lipoprotein A looks good, but all other results are in higher risk category.

## 2019-04-16 ENCOUNTER — Ambulatory Visit: Payer: Self-pay | Admitting: Family Medicine

## 2019-04-17 ENCOUNTER — Ambulatory Visit: Payer: No Typology Code available for payment source | Admitting: Family Medicine

## 2019-04-24 ENCOUNTER — Encounter: Payer: Self-pay | Admitting: Family Medicine

## 2019-04-24 ENCOUNTER — Other Ambulatory Visit: Payer: Self-pay

## 2019-04-24 ENCOUNTER — Ambulatory Visit (INDEPENDENT_AMBULATORY_CARE_PROVIDER_SITE_OTHER): Payer: BC Managed Care – PPO | Admitting: Family Medicine

## 2019-04-24 VITALS — BP 125/76 | HR 74 | Ht 70.0 in | Wt 229.4 lb

## 2019-04-24 DIAGNOSIS — M2550 Pain in unspecified joint: Secondary | ICD-10-CM | POA: Diagnosis not present

## 2019-04-24 DIAGNOSIS — F5104 Psychophysiologic insomnia: Secondary | ICD-10-CM | POA: Diagnosis not present

## 2019-04-24 MED ORDER — MELOXICAM 15 MG PO TABS: 7.5000 mg | ORAL_TABLET | Freq: Every day | ORAL | 6 refills | Status: DC | PRN

## 2019-04-24 MED ORDER — HYDROCODONE-ACETAMINOPHEN 5-325 MG PO TABS: 1.0000 | ORAL_TABLET | Freq: Four times a day (QID) | ORAL | 0 refills | Status: DC | PRN

## 2019-04-24 MED ORDER — METHYLPREDNISOLONE 4 MG PO TBPK: ORAL_TABLET | ORAL | 0 refills | Status: DC

## 2019-04-24 NOTE — Progress Notes (Signed)
Office Visit Note   Patient: Bill Kennedy           Date of Birth: [birth year]           MRN: [id number] Visit Date: 04/24/2019 Requested by: Eunice Blase, MD [address] [place],  [place] [address] PCP: Eunice Blase, MD  Subjective: Chief Complaint  Patient presents with  . 6 months f/u - insomnia, discuss bloodwork    HPI: Here for follow-up.  Still complaining of chronic insomnia.  Can't get more than 6 hours sleep on best nights.  Having chronic whole body pain.  "Everything hurts".  Would like something to take on rare occasions that he can't tolerate the pain.  Would like to have additional testing for causes of inflammation.  Didn't get any useful information from food elimination diet.  Recent outside labs showed elevated AM and noon cortisol levels.  Others were in normal range.                ROS: No fevers/chills.  All other systems were reviewed and are negative.  Objective: Vital Signs: BP 125/76   Pulse 74   Ht 5\' 10"  (1.778 m)   Wt 229 lb 6.4 oz (104.1 kg)   BMI 32.92 kg/m   Physical Exam:  General:  Alert and oriented, in no acute distress. Pulm:  Breathing unlabored. Psy:  Normal mood, congruent affect. Skin:  No visible rash.  No other exam done.  Imaging: None.  Assessment & Plan: 1.  Chronic insomnia - Possibly related to cortisol abnormalities.  Will do additional research into his lab results.  2.  Multiple joint pain - Labs to evaluate. - Medrol pack.  Meloxicam prn.  Hydrocodone only very rarely, for severe pain.     Procedures: No procedures performed  No notes on file     PMFS History: Patient Active Problem List   Diagnosis Date Noted  . Chronic insomnia 04/24/2019  . Testosterone deficiency 10/13/2018  . Hyperlipidemia 10/13/2018  . Obesity (BMI 30.0-34.9) 10/13/2018  . Lumbar spondylolysis 10/13/2018  . Chronic pain of both shoulders 10/13/2018  . Chronic pain of both knees 10/13/2018  . Routine general  medical examination at a health care facility 07/20/2011  . Chronic fatigue 07/20/2011  . Libido, decreased 07/20/2011  . Screen for colon cancer 07/20/2011  . Screening for prostate cancer 07/20/2011  . Need for Tdap vaccination 07/20/2011  . Actinic keratoses 07/20/2011   Past Medical History:  Diagnosis Date  . Hematuria    hospitalization in remote past, etiology unknown, and symptoms cleared  . Insomnia   . Joint pain    hx/o bone spur of right AC joint  . Nearsightedness    wears glasses  . OCD (obsessive compulsive disorder)    Dr. Toy Care  . Personal history of goiter     Family History  Problem Relation Age of Onset  . Diabetes Mother   . Macular degeneration Mother   . Hypertension Mother   . Other Father        unknown  . Anxiety disorder Sister   . Anxiety disorder Brother   . Heart disease Neg Hx   . Cancer Neg Hx   . Stroke Neg Hx   . Hyperlipidemia Neg Hx     History reviewed. No pertinent surgical history. Social History   Occupational History  . Occupation: manual body therapy    Employer: [employer]  Tobacco Use  . Smoking status: Never Smoker  .  Smokeless tobacco: Never Used  Substance and Sexual Activity  . Alcohol use: Yes    Alcohol/week: 1.0 standard drinks    Types: 1 drink(s) per week  . Drug use: No  . Sexual activity: Not on file

## 2019-04-25 ENCOUNTER — Telehealth: Payer: Self-pay | Admitting: Family Medicine

## 2019-04-25 NOTE — Telephone Encounter (Signed)
Two results pending, others normal so far.  [place]

## 2019-04-27 ENCOUNTER — Encounter: Payer: Self-pay | Admitting: Family Medicine

## 2019-04-27 LAB — CK: Total CK: 117 U/L (ref 44–196)

## 2019-04-27 LAB — URIC ACID: Uric Acid, Serum: 5.7 mg/dL (ref 4.0–8.0)

## 2019-04-27 LAB — RHEUMATOID FACTOR: Rheumatoid fact SerPl-aCnc: <14 IU/mL (ref <14)

## 2019-04-27 LAB — SEDIMENTATION RATE: Sed Rate: 2 mm/h (ref 0–20)

## 2019-04-27 LAB — CYCLIC CITRUL PEPTIDE ANTIBODY, IGG: Cyclic Citrullin Peptide Ab: <16 UNITS

## 2019-04-27 LAB — ANA: Anti Nuclear Antibody (ANA): NEGATIVE

## 2019-05-09 ENCOUNTER — Encounter: Payer: Self-pay | Admitting: Family Medicine

## 2019-05-11 ENCOUNTER — Telehealth: Payer: Self-pay | Admitting: Family Medicine

## 2019-05-11 NOTE — Telephone Encounter (Signed)
Called patient left information for him to contact [facility] concernig his lab work not being sent to his [facility]  [phone]

## 2019-05-15 ENCOUNTER — Encounter: Payer: Self-pay | Admitting: Family Medicine

## 2019-05-19 ENCOUNTER — Encounter: Payer: Self-pay | Admitting: Family Medicine

## 2019-05-21 ENCOUNTER — Other Ambulatory Visit: Payer: Self-pay

## 2019-05-21 DIAGNOSIS — M47814 Spondylosis without myelopathy or radiculopathy, thoracic region: Secondary | ICD-10-CM

## 2019-05-30 ENCOUNTER — Encounter: Payer: Self-pay | Admitting: Family Medicine

## 2019-06-05 ENCOUNTER — Encounter: Payer: Self-pay | Admitting: Family Medicine

## 2019-08-22 ENCOUNTER — Ambulatory Visit: Payer: BC Managed Care – PPO | Admitting: Family Medicine

## 2019-09-19 ENCOUNTER — Encounter: Payer: Self-pay | Admitting: Family Medicine

## 2019-09-19 ENCOUNTER — Ambulatory Visit (INDEPENDENT_AMBULATORY_CARE_PROVIDER_SITE_OTHER): Payer: BC Managed Care – PPO | Admitting: Family Medicine

## 2019-09-19 ENCOUNTER — Other Ambulatory Visit: Payer: Self-pay

## 2019-09-19 DIAGNOSIS — M47814 Spondylosis without myelopathy or radiculopathy, thoracic region: Secondary | ICD-10-CM | POA: Diagnosis not present

## 2019-09-19 DIAGNOSIS — E291 Testicular hypofunction: Secondary | ICD-10-CM | POA: Diagnosis not present

## 2019-09-19 DIAGNOSIS — E8881 Metabolic syndrome: Secondary | ICD-10-CM | POA: Diagnosis not present

## 2019-09-19 MED ORDER — HYDROCODONE-ACETAMINOPHEN 5-325 MG PO TABS: 1.0000 | ORAL_TABLET | Freq: Four times a day (QID) | ORAL | 0 refills | Status: DC | PRN

## 2019-09-19 NOTE — Progress Notes (Signed)
Office Visit Note   Patient: Bill Kennedy           Date of Birth: 1961-06-17           MRN: [id number] Visit Date: 09/19/2019 Requested by: Lavada Mesi, MD [address] [place],  [place] [address] PCP: Lavada Mesi, MD  Subjective: Chief Complaint  Patient presents with  . Wellness Followup    HPI: He is here for follow-up chronic pain in multiple areas.  Primarily we are focusing on his cervical and thoracic spine pain.  Since last visit about 3 months ago he went to [facility] and had PRP and prolotherapy injections along the cervical and thoracic facet joints and in the right shoulder.  Overall he has noticed some improvement but not a lot.  He has tried multiple treatments in the past including physical therapy, chiropractic, low-dose naltrexone.  He uses hydrocodone only for severe pain.  He is interested in consulting with a surgeon to see if there are any surgical options that might be of benefit to him.  He has had x-rays but not an MRI scan.  He has his x-rays on a CD at home.  He is due for labs to monitor metabolic syndrome with hyperglycemia.  His last A1c and fasting insulin levels were normal.  He periodically checks his blood sugars and usually in the morning readings are slightly elevated but the remainder are usually better.  He also needs labs to monitor his testosterone deficiency.  Few months ago his CBC showed elevated hemoglobin of around 10.  He went to have phlebotomy treatment but his hemoglobin was normal there.    Objective: Vital Signs: There were no vitals taken for this visit.  Physical Exam:  General:  Alert and oriented, in no acute distress. Pulm:  Breathing unlabored. Psy:  Normal mood, congruent affect.  Thoracic spine: He has some tenderness diffusely but primarily around C7 and in the midthoracic area.  Imaging: None today  Assessment & Plan: 1.  Chronic cervical and thoracic back pain -We will schedule a thoracic MRI scan as well as  consultation with Dr. Danielle Dess.  He will bring his plain x-rays with him to his MRI.  2.  Metabolic syndrome -Labs to evaluate.  3.  Testosterone deficiency -Recheck levels today.     Procedures: No procedures performed  No notes on file     PMFS History: Patient Active Problem List   Diagnosis Date Noted  . Chronic insomnia 04/24/2019  . Testosterone deficiency 10/13/2018  . Hyperlipidemia 10/13/2018  . Obesity (BMI 30.0-34.9) 10/13/2018  . Lumbar spondylolysis 10/13/2018  . Chronic pain of both shoulders 10/13/2018  . Chronic pain of both knees 10/13/2018  . Routine general medical examination at a health care facility 07/20/2011  . Chronic fatigue 07/20/2011  . Libido, decreased 07/20/2011  . Screen for colon cancer 07/20/2011  . Screening for prostate cancer 07/20/2011  . Need for Tdap vaccination 07/20/2011  . Actinic keratoses 07/20/2011   Past Medical History:  Diagnosis Date  . Hematuria    hospitalization in remote past, etiology unknown, and symptoms cleared  . Insomnia   . Joint pain    hx/o bone spur of right AC joint  . Nearsightedness    wears glasses  . OCD (obsessive compulsive disorder)    Dr. Evelene Croon  . Personal history of goiter     Family History  Problem Relation Age of Onset  . Diabetes Mother   . Macular degeneration Mother   .  Hypertension Mother   . Other Father        unknown  . Anxiety disorder Sister   . Anxiety disorder Brother   . Heart disease Neg Hx   . Cancer Neg Hx   . Stroke Neg Hx   . Hyperlipidemia Neg Hx     History reviewed. No pertinent surgical history. Social History   Occupational History  . Occupation: manual body therapy    Employer: [employer]  Tobacco Use  . Smoking status: Never Smoker  . Smokeless tobacco: Never Used  Substance and Sexual Activity  . Alcohol use: Yes    Alcohol/week: 1.0 standard drinks    Types: 1 drink(s) per week  . Drug use: No  . Sexual activity: Not on file

## 2019-09-20 ENCOUNTER — Telehealth: Payer: Self-pay | Admitting: Family Medicine

## 2019-09-20 LAB — COMPREHENSIVE METABOLIC PANEL
AG Ratio: 2.1 (calc) (ref 1.0–2.5)
ALT: 42 U/L (ref 9–46)
AST: 32 U/L (ref 10–35)
Albumin: 4.7 g/dL (ref 3.6–5.1)
Alkaline phosphatase (APISO): 45 U/L (ref 35–144)
BUN: 14 mg/dL (ref 7–25)
CO2: 28 mmol/L (ref 20–32)
Calcium: 10.3 mg/dL (ref 8.6–10.3)
Chloride: 102 mmol/L (ref 98–110)
Creat: 0.9 mg/dL (ref 0.70–1.33)
Globulin: 2.2 g/dL (calc) (ref 1.9–3.7)
Glucose, Bld: 103 mg/dL — ABNORMAL HIGH (ref 65–99)
Potassium: 4.8 mmol/L (ref 3.5–5.3)
Sodium: 138 mmol/L (ref 135–146)
Total Bilirubin: 1.2 mg/dL (ref 0.2–1.2)
Total Protein: 6.9 g/dL (ref 6.1–8.1)

## 2019-09-20 LAB — CBC WITH DIFFERENTIAL/PLATELET
Absolute Monocytes: 540 cells/uL (ref 200–950)
Basophils Absolute: 38 cells/uL (ref 0–200)
Basophils Relative: 0.5 %
Eosinophils Absolute: 143 cells/uL (ref 15–500)
Eosinophils Relative: 1.9 %
HCT: 51.3 % — ABNORMAL HIGH (ref 38.5–50.0)
Hemoglobin: 17.7 g/dL — ABNORMAL HIGH (ref 13.2–17.1)
Lymphs Abs: 1680 cells/uL (ref 850–3900)
MCH: 32.1 pg (ref 27.0–33.0)
MCHC: 34.5 g/dL (ref 32.0–36.0)
MCV: 93.1 fL (ref 80.0–100.0)
MPV: 11.1 fL (ref 7.5–12.5)
Monocytes Relative: 7.2 %
Neutro Abs: 5100 cells/uL (ref 1500–7800)
Neutrophils Relative %: 68 %
Platelets: 203 10*3/uL (ref 140–400)
RBC: 5.51 10*6/uL (ref 4.20–5.80)
RDW: 13.2 % (ref 11.0–15.0)
Total Lymphocyte: 22.4 %
WBC: 7.5 10*3/uL (ref 3.8–10.8)

## 2019-09-20 LAB — HEMOGLOBIN A1C
Hgb A1c MFr Bld: 5.2 % of total Hgb (ref <5.7)
Mean Plasma Glucose: 103 (calc)
eAG (mmol/L): 5.7 (calc)

## 2019-09-20 LAB — TESTOSTERONE TOTAL,FREE,BIO, MALES
Albumin: 4.7 g/dL (ref 3.6–5.1)
Sex Hormone Binding: 28 nmol/L (ref 22–77)
Testosterone, Bioavailable: 258.2 ng/dL (ref >=110.0)
Testosterone, Free: 120.5 pg/mL (ref 46.0–224.0)
Testosterone: 734 ng/dL (ref 250–827)

## 2019-09-20 NOTE — Telephone Encounter (Signed)
Glucose is still in prediabetes range at 103, but A1C remains normal.  All else looks good.

## 2019-09-21 ENCOUNTER — Other Ambulatory Visit: Payer: Self-pay | Admitting: Family Medicine

## 2019-09-21 DIAGNOSIS — Z77018 Contact with and (suspected) exposure to other hazardous metals: Secondary | ICD-10-CM

## 2019-09-25 ENCOUNTER — Encounter: Payer: Self-pay | Admitting: Family Medicine

## 2019-10-10 ENCOUNTER — Other Ambulatory Visit: Payer: BC Managed Care – PPO

## 2019-10-23 ENCOUNTER — Encounter: Payer: Self-pay | Admitting: Family Medicine

## 2019-12-03 ENCOUNTER — Encounter: Payer: Self-pay | Admitting: Family Medicine

## 2019-12-03 MED ORDER — HYDROCODONE-ACETAMINOPHEN 5-325 MG PO TABS: 1.0000 | ORAL_TABLET | Freq: Four times a day (QID) | ORAL | 0 refills | Status: DC | PRN

## 2020-01-05 ENCOUNTER — Encounter: Payer: Self-pay | Admitting: Family Medicine

## 2020-01-10 ENCOUNTER — Ambulatory Visit
Admission: RE | Admit: 2020-01-10 | Discharge: 2020-01-10 | Disposition: A | Discharge status: Home / Self Care | Payer: BC Managed Care – PPO | Source: Ambulatory Visit | Attending: Family Medicine | Admitting: Family Medicine

## 2020-01-10 ENCOUNTER — Other Ambulatory Visit: Payer: Self-pay

## 2020-01-10 DIAGNOSIS — M47814 Spondylosis without myelopathy or radiculopathy, thoracic region: Secondary | ICD-10-CM

## 2020-01-10 DIAGNOSIS — Z77018 Contact with and (suspected) exposure to other hazardous metals: Secondary | ICD-10-CM

## 2020-01-14 ENCOUNTER — Telehealth: Payer: Self-pay | Admitting: Family Medicine

## 2020-01-14 NOTE — Telephone Encounter (Signed)
Thoracic MRI shows mild degenerative changes.  No nerve compression, no clear-cut indication for surgery.

## 2020-01-31 ENCOUNTER — Encounter: Payer: Self-pay | Admitting: Family Medicine

## 2020-03-20 ENCOUNTER — Encounter: Payer: Self-pay | Admitting: Family Medicine

## 2020-03-20 MED ORDER — HYDROCODONE-ACETAMINOPHEN 5-325 MG PO TABS: 1.0000 | ORAL_TABLET | Freq: Four times a day (QID) | ORAL | 0 refills | Status: DC | PRN

## 2020-05-07 ENCOUNTER — Encounter: Payer: Self-pay | Admitting: Family Medicine

## 2020-05-07 ENCOUNTER — Ambulatory Visit (INDEPENDENT_AMBULATORY_CARE_PROVIDER_SITE_OTHER): Payer: BC Managed Care – PPO | Admitting: Family Medicine

## 2020-05-07 ENCOUNTER — Other Ambulatory Visit: Payer: Self-pay

## 2020-05-07 DIAGNOSIS — R339 Retention of urine, unspecified: Secondary | ICD-10-CM

## 2020-05-07 DIAGNOSIS — R300 Dysuria: Secondary | ICD-10-CM

## 2020-05-07 MED ORDER — SULFAMETHOXAZOLE-TRIMETHOPRIM 800-160 MG PO TABS: 1.0000 | ORAL_TABLET | Freq: Two times a day (BID) | ORAL | 1 refills | Status: DC

## 2020-05-07 NOTE — Progress Notes (Signed)
   Office Visit Note   Patient: Bill Kennedy           Date of Birth: Nov 06, 1960           MRN: [id number] Visit Date: 05/07/2020 Requested by: Lavada Mesi, MD [address] [place],  [place] [address] PCP: Lavada Mesi, MD  Subjective: Chief Complaint  Patient presents with  . urinary issues x several wks    feels like he still needs to empty his bladder, after he urinates  . requests prostate check    HPI: He is here with urinary concerns.  For the past couple weeks he has had a sensation of fullness in the rectal area, and difficulty completely emptying his bladder.  No fevers or chills, no dysuria or urinary frequency.  He has never had troubles with bladder infections or prostate problems.               ROS:   All other systems were reviewed and are negative.  Objective: Vital Signs: There were no vitals taken for this visit.  Physical Exam:  General:  Alert and oriented, in no acute distress. Pulm:  Breathing unlabored. Psy:  Normal mood, congruent affect.  GU:  Prostate slightly enlarged, no nodules, non-tender.    Imaging: No results found.  Assessment & Plan: 1.  Urinary hesitancy, question prostatitis - UA, start bactrim.  If no relief and urine is normal, will try flomax.     Procedures: No procedures performed  No notes on file     PMFS History: Patient Active Problem List   Diagnosis Date Noted  . Chronic insomnia 04/24/2019  . Testosterone deficiency 10/13/2018  . Hyperlipidemia 10/13/2018  . Obesity (BMI 30.0-34.9) 10/13/2018  . Lumbar spondylolysis 10/13/2018  . Chronic pain of both shoulders 10/13/2018  . Chronic pain of both knees 10/13/2018  . Routine general medical examination at a health care facility 07/20/2011  . Chronic fatigue 07/20/2011  . Libido, decreased 07/20/2011  . Screen for colon cancer 07/20/2011  . Screening for prostate cancer 07/20/2011  . Need for Tdap vaccination 07/20/2011  . Actinic keratoses 07/20/2011    Past Medical History:  Diagnosis Date  . Hematuria    hospitalization in remote past, etiology unknown, and symptoms cleared  . Insomnia   . Joint pain    hx/o bone spur of right AC joint  . Nearsightedness    wears glasses  . OCD (obsessive compulsive disorder)    Dr. Evelene Croon  . Personal history of goiter     Family History  Problem Relation Age of Onset  . Diabetes Mother   . Macular degeneration Mother   . Hypertension Mother   . Other Father        unknown  . Anxiety disorder Sister   . Anxiety disorder Brother   . Heart disease Neg Hx   . Cancer Neg Hx   . Stroke Neg Hx   . Hyperlipidemia Neg Hx     History reviewed. No pertinent surgical history. Social History   Occupational History  . Occupation: manual body therapy    Employer: [employer]  Tobacco Use  . Smoking status: Never Smoker  . Smokeless tobacco: Never Used  Substance and Sexual Activity  . Alcohol use: Yes    Alcohol/week: 1.0 standard drink    Types: 1 drink(s) per week  . Drug use: No  . Sexual activity: Not on file

## 2020-05-08 ENCOUNTER — Telehealth: Payer: Self-pay | Admitting: Family Medicine

## 2020-05-08 ENCOUNTER — Encounter: Payer: Self-pay | Admitting: Family Medicine

## 2020-05-08 LAB — URINALYSIS W MICROSCOPIC + REFLEX CULTURE
Bacteria, UA: NONE SEEN /HPF
Bilirubin Urine: NEGATIVE
Glucose, UA: NEGATIVE
Hgb urine dipstick: NEGATIVE
Hyaline Cast: NONE SEEN /LPF
Leukocyte Esterase: NEGATIVE
Nitrites, Initial: NEGATIVE
Protein, ur: NEGATIVE
Specific Gravity, Urine: 1.013 (ref 1.001–1.03)
Squamous Epithelial / HPF: NONE SEEN /HPF (ref <=5)
WBC, UA: NONE SEEN /HPF (ref 0–5)
pH: <=5 (ref 5.0–8.0)

## 2020-05-08 LAB — NO CULTURE INDICATED

## 2020-05-08 MED ORDER — TAMSULOSIN HCL 0.4 MG PO CAPS: 0.4000 mg | ORAL_CAPSULE | Freq: Every day | ORAL | 6 refills | Status: DC

## 2020-05-08 NOTE — Telephone Encounter (Signed)
Urinalysis looks basically normal, no sign of infection.

## 2020-06-19 ENCOUNTER — Other Ambulatory Visit: Payer: Self-pay | Admitting: Family Medicine

## 2020-06-20 MED ORDER — HYDROCODONE-ACETAMINOPHEN 5-325 MG PO TABS: 1.0000 | ORAL_TABLET | Freq: Four times a day (QID) | ORAL | 0 refills | Status: DC | PRN

## 2020-07-25 ENCOUNTER — Ambulatory Visit (INDEPENDENT_AMBULATORY_CARE_PROVIDER_SITE_OTHER): Payer: BC Managed Care – PPO | Admitting: Family Medicine

## 2020-07-25 ENCOUNTER — Other Ambulatory Visit: Payer: Self-pay

## 2020-07-25 ENCOUNTER — Encounter: Payer: Self-pay | Admitting: Family Medicine

## 2020-07-25 VITALS — BP 120/72 | HR 81 | Ht 69.75 in | Wt 228.6 lb

## 2020-07-25 DIAGNOSIS — E669 Obesity, unspecified: Secondary | ICD-10-CM

## 2020-07-25 DIAGNOSIS — E349 Endocrine disorder, unspecified: Secondary | ICD-10-CM

## 2020-07-25 DIAGNOSIS — R739 Hyperglycemia, unspecified: Secondary | ICD-10-CM

## 2020-07-25 DIAGNOSIS — E785 Hyperlipidemia, unspecified: Secondary | ICD-10-CM | POA: Diagnosis not present

## 2020-07-25 DIAGNOSIS — Z0000 Encounter for general adult medical examination without abnormal findings: Secondary | ICD-10-CM

## 2020-07-25 DIAGNOSIS — Z1211 Encounter for screening for malignant neoplasm of colon: Secondary | ICD-10-CM

## 2020-07-25 DIAGNOSIS — R5382 Chronic fatigue, unspecified: Secondary | ICD-10-CM

## 2020-07-25 DIAGNOSIS — F5104 Psychophysiologic insomnia: Secondary | ICD-10-CM

## 2020-07-25 DIAGNOSIS — G894 Chronic pain syndrome: Secondary | ICD-10-CM | POA: Insufficient documentation

## 2020-07-25 NOTE — Progress Notes (Signed)
Office Visit Note   Patient: Bill Kennedy           Date of Birth: 08-Oct-1960           MRN: [id number] Visit Date: 07/25/2020 Requested by: Lavada Mesi, MD [address] [place],  [place] [address] PCP: Lavada Mesi, MD  Subjective: Chief Complaint  Patient presents with  . Annual Exam    HPI:  He Is here for annual wellness exam.  No change in his chronic symptoms of fatigue and pain.  His BPH symptoms are improved on Flomax, but he is wondering if he can stop it.  Also recently he had an episode of what sounds like Peyronie disease.  Seems to be better, but not yet normal.  He is seeing an integrative medicine provider in [facility] who wanted some additional labs drawn.  We will have all of his labs done at [facility] today.  He is eating healthy most of the time and exercising regularly.  He is due for colon cancer screening of some sort, but would like to do the Cologuard test.  He recently had an eye exam and goes to see the dentist next week.                ROS:   All other systems were reviewed and are negative.  Objective: Vital Signs: BP 120/72   Pulse 81   Ht 5' 9.75" (1.772 m)   Wt 228 lb 9.6 oz (103.7 kg)   BMI 33.04 kg/m   Physical Exam:  General:  Alert and oriented, in no acute distress. Pulm:  Breathing unlabored. Psy:  Normal mood, congruent affect. Skin: No suspicious lesions seen. HEENT:  [place]/AT, PERRLA, EOM Full, no nystagmus.  Funduscopic examination within normal limits.  No conjunctival erythema.  Tympanic membranes are pearly gray with normal landmarks.  External ear canals are normal.  Nasal passages are clear.  Oropharynx is clear.  No significant lymphadenopathy.  No thyromegaly or nodules.  2+ carotid pulses without bruits. CV: Regular rate and rhythm without murmurs, rubs, or gallops.  No peripheral edema.  2+ radial and posterior tibial pulses. Lungs: Clear to auscultation throughout with no wheezing or areas of consolidation. Abd:  Bowel sounds are active, no hepatosplenomegaly or masses.  Soft and nontender.  No audible bruits.  No evidence of ascites. Extremities: 2+ upper and lower DTRs.  No nail deformities.   Imaging: No results found.  Assessment & Plan: 1.  Wellness exam -Labs ordered.  Cologuard ordered.  2.  BPH, improving. -He will stop Flomax.  If symptoms return, then urology consult.  3.  Chronic pain syndrome - At this point a prescription for hydrocodone is lasting 3 months.  If he starts wanting more frequent dosing, he plans to consult with a pain clinic.     Procedures: No procedures performed        PMFS History: Patient Active Problem List   Diagnosis Date Noted  . Chronic insomnia 04/24/2019  . Testosterone deficiency 10/13/2018  . Hyperlipidemia 10/13/2018  . Obesity (BMI 30.0-34.9) 10/13/2018  . Lumbar spondylolysis 10/13/2018  . Chronic pain of both shoulders 10/13/2018  . Chronic pain of both knees 10/13/2018  . Routine general medical examination at a health care facility 07/20/2011  . Chronic fatigue 07/20/2011  . Libido, decreased 07/20/2011  . Screen for colon cancer 07/20/2011  . Screening for prostate cancer 07/20/2011  . Need for Tdap vaccination 07/20/2011  . Actinic keratoses 07/20/2011   Past  Medical History:  Diagnosis Date  . Hematuria    hospitalization in remote past, etiology unknown, and symptoms cleared  . Insomnia   . Joint pain    hx/o bone spur of right AC joint  . Nearsightedness    wears glasses  . OCD (obsessive compulsive disorder)    Dr. Evelene Croon  . Personal history of goiter     Family History  Problem Relation Age of Onset  . Diabetes Mother   . Macular degeneration Mother   . Hypertension Mother   . Other Father        unknown  . Anxiety disorder Sister   . Anxiety disorder Brother   . Heart disease Neg Hx   . Cancer Neg Hx   . Stroke Neg Hx   . Hyperlipidemia Neg Hx     No past surgical history on file. Social History    Occupational History  . Occupation: manual body therapy    Employer: [employer]  Tobacco Use  . Smoking status: Never Smoker  . Smokeless tobacco: Never Used  Substance and Sexual Activity  . Alcohol use: Yes    Alcohol/week: 1.0 standard drink    Types: 1 drink(s) per week  . Drug use: No  . Sexual activity: Not on file

## 2020-07-26 LAB — HIGH SENSITIVITY CRP: hs-CRP: 0.5 mg/L

## 2020-07-31 ENCOUNTER — Encounter: Payer: Self-pay | Admitting: Family Medicine

## 2020-08-03 LAB — TESTOSTERONE TOTAL,FREE,BIO, MALES
Albumin: 4.6 g/dL (ref 3.6–5.1)
Sex Hormone Binding: 25 nmol/L (ref 22–77)
Testosterone, Bioavailable: 260.4 ng/dL (ref >=110.0)
Testosterone, Free: 124 pg/mL (ref 46.0–224.0)
Testosterone: 694 ng/dL (ref 250–827)

## 2020-08-03 LAB — COMPREHENSIVE METABOLIC PANEL
AG Ratio: 2.4 (calc) (ref 1.0–2.5)
ALT: 35 U/L (ref 9–46)
AST: 27 U/L (ref 10–35)
Albumin: 4.6 g/dL (ref 3.6–5.1)
Alkaline phosphatase (APISO): 43 U/L (ref 35–144)
BUN: 13 mg/dL (ref 7–25)
CO2: 25 mmol/L (ref 20–32)
Calcium: 9.6 mg/dL (ref 8.6–10.3)
Chloride: 104 mmol/L (ref 98–110)
Creat: 0.89 mg/dL (ref 0.70–1.33)
Globulin: 1.9 g/dL (calc) (ref 1.9–3.7)
Glucose, Bld: 103 mg/dL — ABNORMAL HIGH (ref 65–99)
Potassium: 4.3 mmol/L (ref 3.5–5.3)
Sodium: 139 mmol/L (ref 135–146)
Total Bilirubin: 1 mg/dL (ref 0.2–1.2)
Total Protein: 6.5 g/dL (ref 6.1–8.1)

## 2020-08-03 LAB — INSULIN, RANDOM: Insulin: 9.3 u[IU]/mL

## 2020-08-03 LAB — CARDIO IQ ADV LIPID AND INFLAMM PNL
Apolipoprotein B: 148 mg/dL — ABNORMAL HIGH (ref <90)
Cholesterol: 268 mg/dL — ABNORMAL HIGH (ref <200)
HDL: 42 mg/dL (ref >39)
LDL Cholesterol (Calc): 194 mg/dL (calc) — ABNORMAL HIGH (ref <100)
LDL Large: 5079 nmol/L — ABNORMAL LOW (ref >6729)
LDL Medium: 565 nmol/L — ABNORMAL HIGH (ref <215)
LDL Particle Number: 2127 nmol/L — ABNORMAL HIGH (ref <1138)
LDL Peak Size: 216.2 Angstrom — ABNORMAL LOW (ref >222.9)
LDL Small: 478 nmol/L — ABNORMAL HIGH (ref <142)
Lipoprotein (a): 45 nmol/L (ref <75)
Non-HDL Cholesterol (Calc): 226 mg/dL (calc) — ABNORMAL HIGH (ref <130)
PLAC: 176 nmol/min/mL — ABNORMAL HIGH (ref <124)
Total CHOL/HDL Ratio: 6.4 calc — ABNORMAL HIGH (ref <3.6)
Triglycerides: 154 mg/dL — ABNORMAL HIGH (ref <150)
hs-CRP: 0.5 mg/L (ref <1.0)

## 2020-08-03 LAB — HEMOGLOBIN A1C
Hgb A1c MFr Bld: 5.4 % of total Hgb (ref <5.7)
Mean Plasma Glucose: 108 mg/dL
eAG (mmol/L): 6 mmol/L

## 2020-08-03 LAB — CBC WITH DIFFERENTIAL/PLATELET
Absolute Monocytes: 472 cells/uL (ref 200–950)
Basophils Absolute: 59 cells/uL (ref 0–200)
Basophils Relative: 1 %
Eosinophils Absolute: 100 cells/uL (ref 15–500)
Eosinophils Relative: 1.7 %
HCT: 51 % — ABNORMAL HIGH (ref 38.5–50.0)
Hemoglobin: 17.6 g/dL — ABNORMAL HIGH (ref 13.2–17.1)
Lymphs Abs: 1457 cells/uL (ref 850–3900)
MCH: 30.6 pg (ref 27.0–33.0)
MCHC: 34.5 g/dL (ref 32.0–36.0)
MCV: 88.7 fL (ref 80.0–100.0)
MPV: 11.4 fL (ref 7.5–12.5)
Monocytes Relative: 8 %
Neutro Abs: 3811 cells/uL (ref 1500–7800)
Neutrophils Relative %: 64.6 %
Platelets: 189 10*3/uL (ref 140–400)
RBC: 5.75 10*6/uL (ref 4.20–5.80)
RDW: 13.3 % (ref 11.0–15.0)
Total Lymphocyte: 24.7 %
WBC: 5.9 10*3/uL (ref 3.8–10.8)

## 2020-08-03 LAB — ESTROGENS, TOTAL: Estrogen: 190.6 pg/mL — ABNORMAL HIGH (ref 60–190)

## 2020-08-03 LAB — PROGESTERONE: Progesterone: <0.5 ng/mL (ref <1.4)

## 2020-08-03 LAB — FSH/LH
FSH: <0.7 m[IU]/mL — ABNORMAL LOW (ref 1.6–8.0)
LH: <0.2 m[IU]/mL — ABNORMAL LOW (ref 1.5–9.3)

## 2020-08-03 LAB — PSA: PSA: 1.31 ng/mL (ref <=4.0)

## 2020-08-03 LAB — URIC ACID: Uric Acid, Serum: 6.3 mg/dL (ref 4.0–8.0)

## 2020-08-03 LAB — T3, REVERSE: T3, Reverse: 15 ng/dL (ref 8–25)

## 2020-08-03 LAB — VITAMIN D 25 HYDROXY (VIT D DEFICIENCY, FRACTURES): Vit D, 25-Hydroxy: 52 ng/mL (ref 30–100)

## 2020-08-03 LAB — DHEA-SULFATE: DHEA-SO4: 45 ug/dL (ref 38–313)

## 2020-08-03 LAB — TSH+FREE T4: TSH W/REFLEX TO FT4: 1.46 mIU/L (ref 0.40–4.50)

## 2020-08-03 LAB — ESTRADIOL: Estradiol: 36 pg/mL (ref <=39)

## 2020-08-03 LAB — T3, FREE: T3, Free: 4 pg/mL (ref 2.3–4.2)

## 2020-08-04 ENCOUNTER — Telehealth: Payer: Self-pay | Admitting: Family Medicine

## 2020-08-04 NOTE — Telephone Encounter (Signed)
Labs show:  Testosterone looks good, but estrogen is still up.  Would discuss with your prescribing doctor.  Still in prediabetes range.    Lipids are still off, although the particle sizes have shifted slightly into better range.  Could contemplate a low-dose statin based on the numbers and the CT calcium score.    All else looks good.

## 2020-08-18 LAB — COLOGUARD
COLOGUARD: NEGATIVE
Cologuard: NEGATIVE

## 2020-09-15 ENCOUNTER — Other Ambulatory Visit: Payer: Self-pay | Admitting: Family Medicine

## 2020-09-15 ENCOUNTER — Encounter: Payer: Self-pay | Admitting: Family Medicine

## 2020-09-16 MED ORDER — HYDROCODONE-ACETAMINOPHEN 5-325 MG PO TABS: 1.0000 | ORAL_TABLET | Freq: Four times a day (QID) | ORAL | 0 refills | Status: DC | PRN

## 2020-10-20 ENCOUNTER — Encounter: Payer: Self-pay | Admitting: Family Medicine

## 2020-10-28 ENCOUNTER — Encounter: Payer: Self-pay | Admitting: Family Medicine

## 2020-12-08 ENCOUNTER — Other Ambulatory Visit: Payer: Self-pay | Admitting: Family Medicine

## 2020-12-09 MED ORDER — HYDROCODONE-ACETAMINOPHEN 5-325 MG PO TABS: 1.0000 | ORAL_TABLET | Freq: Four times a day (QID) | ORAL | 0 refills | Status: DC | PRN

## 2021-05-03 IMAGING — CR DG ORBITS FOR FOREIGN BODY
2 series · 2 of 2 positions shown · non-contrast
Comparison: None.

CLINICAL DATA: Metal working/exposure; clearance prior to MRI

EXAM:
ORBITS FOR FOREIGN BODY - 2 VIEW

[w orbit pa (1 of 2)]
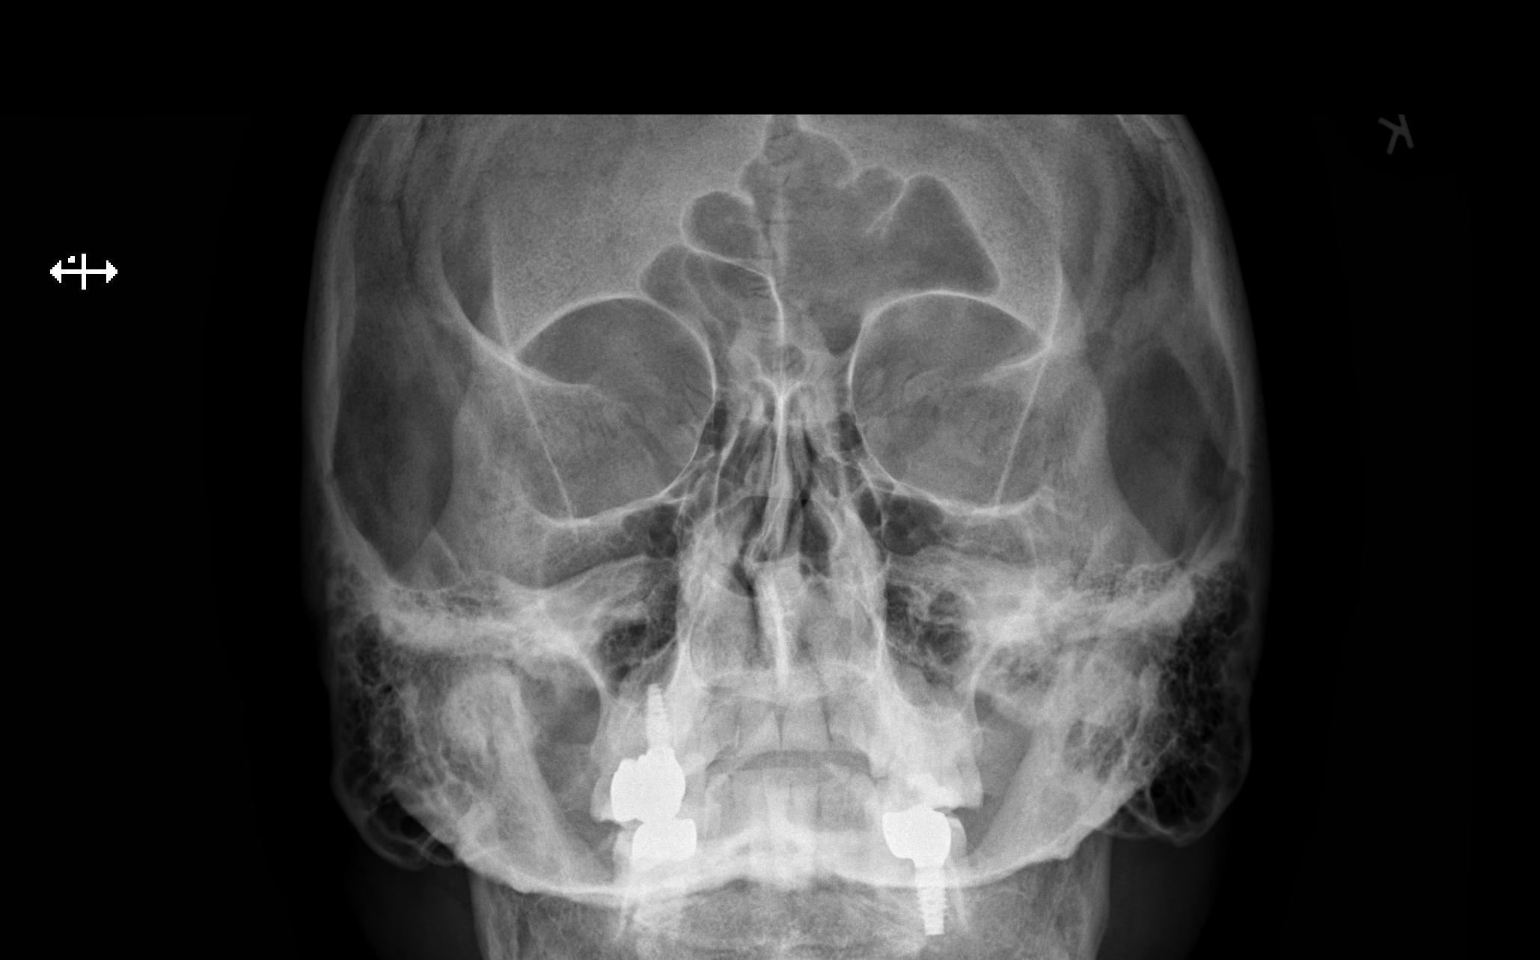

[w orbit pa (2 of 2)]
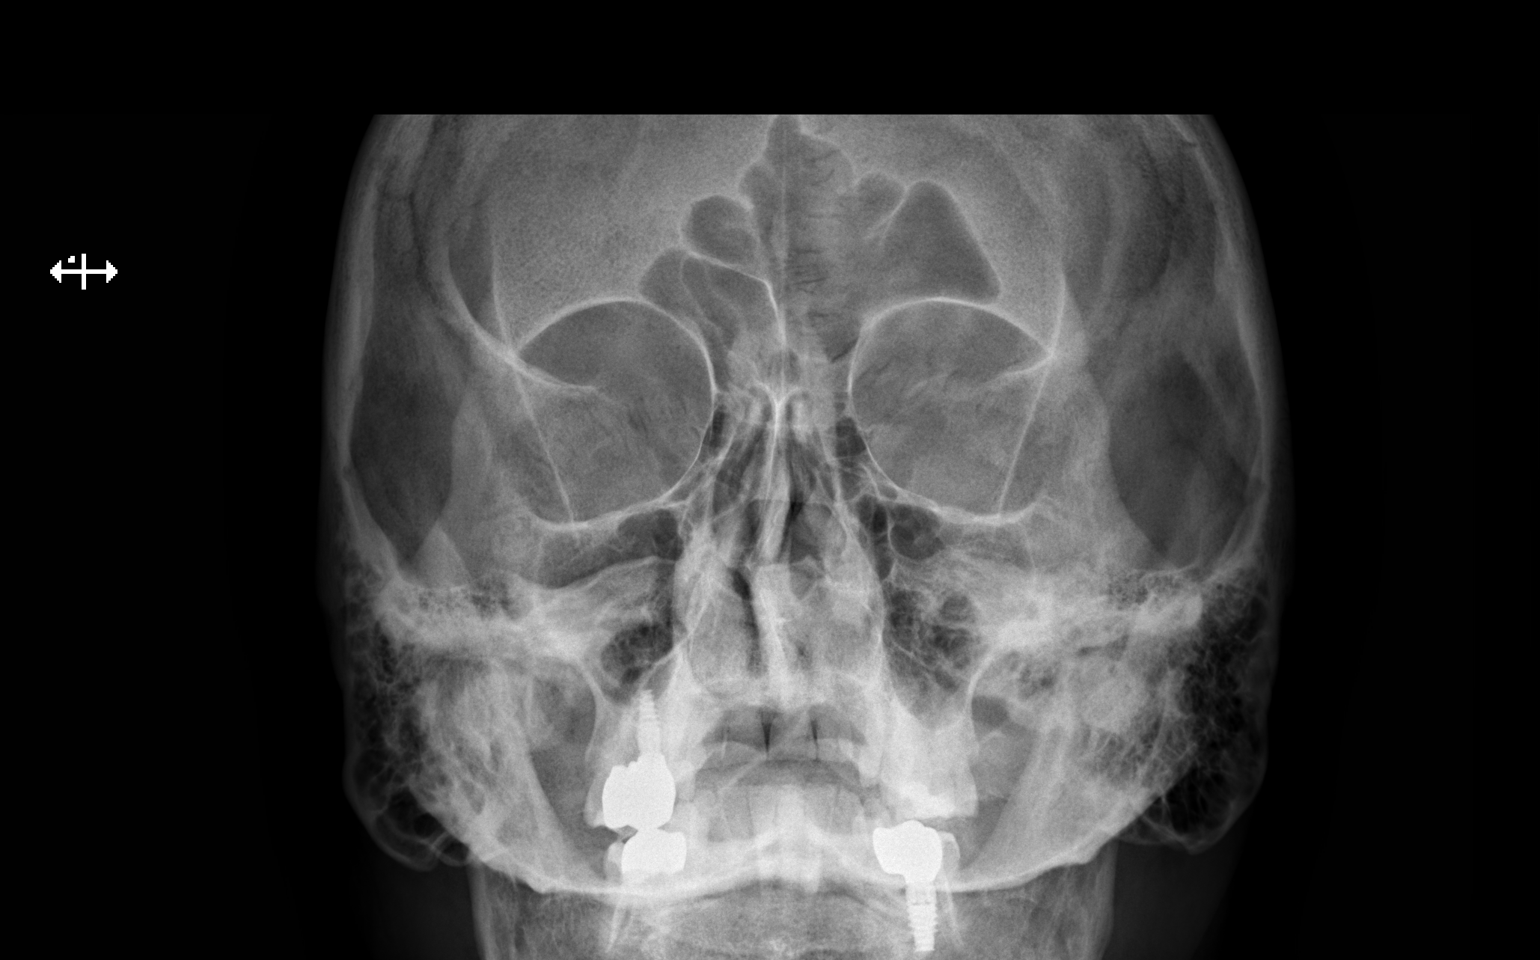

[2 of 2 positions shown; findings below may reference images not displayed]

FINDINGS: There is no evidence of metallic foreign body within the orbits. No
significant bone abnormality identified.
IMPRESSION: No evidence of metallic foreign body within the orbits.

## 2024-06-30 ENCOUNTER — Encounter (HOSPITAL_COMMUNITY): Payer: Self-pay | Admitting: *Deleted

## 2024-06-30 ENCOUNTER — Emergency Department (HOSPITAL_COMMUNITY)

## 2024-06-30 ENCOUNTER — Other Ambulatory Visit: Payer: Self-pay

## 2024-06-30 ENCOUNTER — Emergency Department (HOSPITAL_COMMUNITY)
Admission: EM | Admit: 2024-06-30 | Discharge: 2024-06-30 | Disposition: A | Discharge status: Home / Self Care | Attending: Emergency Medicine | Admitting: Emergency Medicine

## 2024-06-30 DIAGNOSIS — R109 Unspecified abdominal pain: Secondary | ICD-10-CM | POA: Diagnosis present

## 2024-06-30 DIAGNOSIS — D72829 Elevated white blood cell count, unspecified: Secondary | ICD-10-CM | POA: Diagnosis not present

## 2024-06-30 HISTORY — DX: Disorder of thyroid, unspecified: E07.9

## 2024-06-30 LAB — CBC WITH DIFFERENTIAL/PLATELET
Abs Immature Granulocytes: 0.13 K/uL — ABNORMAL HIGH (ref 0.00–0.07)
Basophils Absolute: 0.1 K/uL (ref 0.0–0.1)
Basophils Relative: 0 %
Eosinophils Absolute: 0 K/uL (ref 0.0–0.5)
Eosinophils Relative: 0 %
HCT: 47.4 % (ref 39.0–52.0)
Hemoglobin: 16.9 g/dL (ref 13.0–17.0)
Immature Granulocytes: 1 %
Lymphocytes Relative: 5 %
Lymphs Abs: 1.1 K/uL (ref 0.7–4.0)
MCH: 29.3 pg (ref 26.0–34.0)
MCHC: 35.7 g/dL (ref 30.0–36.0)
MCV: 82.1 fL (ref 80.0–100.0)
Monocytes Absolute: 1.2 K/uL — ABNORMAL HIGH (ref 0.1–1.0)
Monocytes Relative: 6 %
Neutro Abs: 19.4 K/uL — ABNORMAL HIGH (ref 1.7–7.7)
Neutrophils Relative %: 88 %
Platelets: 251 K/uL (ref 150–400)
RBC: 5.77 MIL/uL (ref 4.22–5.81)
RDW: 14.2 % (ref 11.5–15.5)
WBC: 21.9 K/uL — ABNORMAL HIGH (ref 4.0–10.5)
nRBC: 0 % (ref 0.0–0.2)

## 2024-06-30 LAB — COMPREHENSIVE METABOLIC PANEL WITH GFR
ALT: 25 U/L (ref 0–44)
AST: 28 U/L (ref 15–41)
Albumin: 4.6 g/dL (ref 3.5–5.0)
Alkaline Phosphatase: 57 U/L (ref 38–126)
Anion gap: 18 — ABNORMAL HIGH (ref 5–15)
BUN: 13 mg/dL (ref 8–23)
CO2: 18 mmol/L — ABNORMAL LOW (ref 22–32)
Calcium: 9.8 mg/dL (ref 8.9–10.3)
Chloride: 102 mmol/L (ref 98–111)
Creatinine, Ser: 0.93 mg/dL (ref 0.61–1.24)
GFR, Estimated: >60 mL/min
Glucose, Bld: 114 mg/dL — ABNORMAL HIGH (ref 70–99)
Potassium: 3.5 mmol/L (ref 3.5–5.1)
Sodium: 138 mmol/L (ref 135–145)
Total Bilirubin: 0.7 mg/dL (ref 0.0–1.2)
Total Protein: 7 g/dL (ref 6.5–8.1)

## 2024-06-30 MED ORDER — SODIUM CHLORIDE 0.9 % IV BOLUS
1000.0000 mL | Freq: Once | INTRAVENOUS | Status: AC
  Administered 2024-06-30: 1000 mL via INTRAVENOUS

## 2024-06-30 MED ORDER — METHOCARBAMOL 500 MG PO TABS: 500.0000 mg | ORAL_TABLET | Freq: Three times a day (TID) | ORAL | 0 refills | Status: AC | PRN

## 2024-06-30 MED ORDER — KETOROLAC TROMETHAMINE 15 MG/ML IJ SOLN
15.0000 mg | Freq: Once | INTRAMUSCULAR | Status: AC
  Administered 2024-06-30: 15 mg via INTRAVENOUS
  Filled 2024-06-30: qty 1

## 2024-06-30 MED ORDER — LORAZEPAM 2 MG/ML IJ SOLN
1.0000 mg | Freq: Once | INTRAMUSCULAR | Status: AC
  Administered 2024-06-30: 1 mg via INTRAVENOUS
  Filled 2024-06-30: qty 1

## 2024-06-30 MED ORDER — ONDANSETRON HCL 4 MG/2ML IJ SOLN
4.0000 mg | Freq: Once | INTRAMUSCULAR | Status: AC
  Administered 2024-06-30: 4 mg via INTRAVENOUS
  Filled 2024-06-30: qty 2

## 2024-06-30 MED ORDER — HYDROMORPHONE HCL 1 MG/ML IJ SOLN
1.0000 mg | Freq: Once | INTRAMUSCULAR | Status: AC
  Administered 2024-06-30: 1 mg via INTRAVENOUS
  Filled 2024-06-30: qty 1

## 2024-06-30 NOTE — ED Triage Notes (Signed)
 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

## 2024-06-30 NOTE — ED Provider Notes (Signed)
 " [facility] Provider Note   CSN: [id number] Arrival date & time: 06/30/24  8171     Patient presents with: Abdominal Pain   Bill Kennedy is a 64 y.o. male.    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  Now having worsening abdominal pain.  States it feels as if the mesentery is coming off his spine.  States he has had much work done like this previously.  States he had broken ribs and broken back because of it.  States he usually does not hurt like this however.  States no work was done on the abdomen however.    Past Medical History:  Diagnosis Date   Hematuria    hospitalization in remote past, etiology unknown, and symptoms cleared   Insomnia    Joint pain    hx/o bone spur of right AC joint   Nearsightedness    wears glasses   OCD (obsessive compulsive disorder)    Dr. Vincente   Personal history of goiter    Thyroid disease     Prior to Admission medications  Medication Sig Start Date End Date Taking? Authorizing Provider  methocarbamol  (ROBAXIN ) 500 MG tablet Take 1 tablet (500 mg total) by mouth every 8 (eight) hours as needed for muscle spasms. 06/30/24  Yes Patsey Lot, MD  ALPRAZolam (XANAX) 1 MG tablet  08/24/18   [provider]  anastrozole (ARIMIDEX) 1 MG tablet  10/10/18   [provider]  ARMOUR THYROID 60 MG tablet  09/23/18   [provider]  B-D 3CC LUER-LOK SYR 22GX1 22G X 1 3 ML MISC USE WITH TESTOSTERONE  INJECTION 07/15/20   [provider]  Cholecalciferol (VITAMIN D -3) 125 MCG (5000 UT) TABS Take by mouth.    [provider]  Cobalamin Combinations (B12 FOLATE PO) Take by mouth.    [provider]  Digestive Enzymes (SUPER ENZYMES) TABS Take by mouth.    [provider]  HYDROcodone -acetaminophen  (NORCO/VICODIN) 5-325 MG tablet Take 1 tablet by mouth every 6 (six) hours as needed for  moderate pain. 12/09/20   Hilts, Michael, MD  iodine-sodium iodide 2-2.4 % solution Apply topically once.    [provider]  meloxicam  (MOBIC ) 15 MG tablet Take 0.5-1 tablets (7.5-15 mg total) by mouth daily as needed for pain. Patient not taking: Reported on 07/25/2020 04/24/19   Hilts, Ozell, MD  Menatetrenone (VITAMIN K2) 100 MCG TABS Take by mouth.    [provider]  Misc Natural Products (ADRENAL PO) Take by mouth. Patient not taking: Reported on 07/25/2020    [provider]  Multiple Vitamin (MULTIVITAMIN) tablet Take 1 tablet by mouth daily.    [provider]  selenium 50 MCG TABS tablet Take 50 mcg by mouth daily.    [provider]  tamsulosin  (FLOMAX ) 0.4 MG CAPS capsule Take 1 capsule (0.4 mg total) by mouth daily after supper. 05/08/20   Hilts, Ozell, MD  testosterone  cypionate (DEPOTESTOSTERONE CYPIONATE) 200 MG/ML injection SMARTSIG:0.7 Milliliter(s) IM Once a Week 07/04/20   [provider]  Zolpidem Tartrate (AMBIEN PO) Take by mouth.      [provider]    Allergies: Phenergan [promethazine]    Review of Systems  Gastrointestinal:  Positive for abdominal pain.    Updated Vital Signs BP (!) 158/82   Pulse (!) 104   Temp 97.7 F (36.5 C)   Resp 15   Ht 5' 9 (1.753  m)   Wt 103.7 kg   SpO2 100%   BMI 33.76 kg/m   Physical Exam  (all labs ordered are listed, but only abnormal results are displayed) Labs Reviewed  COMPREHENSIVE METABOLIC PANEL WITH GFR - Abnormal; Notable for the following components:      Result Value   CO2 18 (*)    Glucose, Bld 114 (*)    Anion gap 18 (*)    All other components within normal limits  CBC WITH DIFFERENTIAL/PLATELET - Abnormal; Notable for the following components:   WBC 21.9 (*)    Neutro Abs 19.4 (*)    Monocytes Absolute 1.2 (*)    Abs Immature Granulocytes 0.13 (*)    All other components within normal limits    EKG: None  Radiology: DG Chest Port 1  View Result Date: 06/30/2024 EXAM: 1 VIEW(S) XRAY OF THE CHEST 06/30/2024 07:13:00 PM COMPARISON: 01/24/2011 CLINICAL HISTORY: Pain FINDINGS: LUNGS AND PLEURA: Low lung volume. Prominent hilar vasculature. No overt pulmonary edema. No focal pulmonary opacity. No pleural effusion. No pneumothorax. HEART AND MEDIASTINUM: No acute abnormality of the cardiac and mediastinal silhouettes. BONES AND SOFT TISSUES: No acute osseous abnormality. IMPRESSION: 1. Pulmonary venous congestion without pulmonary edema. Electronically signed by: Morgane Naveau MD MD 06/30/2024 07:19 PM EST RP Workstation: [id number]     Procedures   Medications Ordered in the ED  HYDROmorphone  (DILAUDID ) injection 1 mg (1 mg Intravenous Given 06/30/24 1912)  ondansetron  (ZOFRAN ) injection 4 mg (4 mg Intravenous Given 06/30/24 1912)  ketorolac  (TORADOL ) 15 MG/ML injection 15 mg (15 mg Intravenous Given 06/30/24 2011)  sodium chloride  0.9 % bolus 1,000 mL (1,000 mLs Intravenous New Bag/Given 06/30/24 2031)  LORazepam  (ATIVAN ) injection 1 mg (1 mg Intravenous Given 06/30/24 2033)                                    Medical Decision Making Risk Prescription drug management.   Patient after structural integration bodywork began to have abdominal pain.  States he feels that everything is off.  Had no worked on the abdomen however.  States he feels as if everything is shifted.  X-rays reassuring.  White count is elevated however.  States he has recently had the flu.  Overall benign exam.  Did have initial tachycardia that is improving.  Has had pain medicines both opiate and NSAID.  Now we will give some Xanax.  Fluid bolus to be given.  However I think overall patient will be able to discharge home.  Discussed with patient.  Still no abdominal tenderness.  Feeling somewhat better after treatment.  No urinary symptoms.  Even with the white count with no tenderness and feeling better do not think we need further workup at this time.   However given good return precautions.  Will return for increasing pain or fevers.  Causes such as diverticulitis considered but felt less likely.  States he did have flu a couple weeks ago.  With no tenderness will discharge.     Final diagnoses:  Abdominal pain, unspecified abdominal location    ED Discharge Orders          Ordered    methocarbamol  (ROBAXIN ) 500 MG tablet  Every 8 hours PRN        06/30/24 2109               Patsey Lot, MD 06/30/24 2110  "

## 2024-06-30 NOTE — Discharge Instructions (Addendum)
 Workup was reviewed except for elevated white count.  Return if developed more abdominal pain.  Watch for fevers also.  Watch for urinary symptoms.

## 2024-06-30 NOTE — ED Triage Notes (Signed)
 The pt is sweating and hyperventilation

## 2024-06-30 NOTE — ED Triage Notes (Signed)
 Pt to ER with c/o severe abdominal pain after having body work done yesterday.

## 2024-07-01 ENCOUNTER — Emergency Department (HOSPITAL_COMMUNITY): Admitting: Anesthesiology

## 2024-07-01 ENCOUNTER — Emergency Department (HOSPITAL_COMMUNITY)

## 2024-07-01 ENCOUNTER — Other Ambulatory Visit: Payer: Self-pay

## 2024-07-01 ENCOUNTER — Observation Stay (HOSPITAL_COMMUNITY): Admission: EM | Admit: 2024-07-01 | Discharge: 2024-07-03 | Disposition: A | Discharge status: Home / Self Care

## 2024-07-01 ENCOUNTER — Encounter (HOSPITAL_COMMUNITY): Payer: Self-pay

## 2024-07-01 ENCOUNTER — Encounter (HOSPITAL_COMMUNITY): Admission: EM | Disposition: A | Payer: Self-pay | Source: Home / Self Care | Attending: Emergency Medicine

## 2024-07-01 DIAGNOSIS — K353 Acute appendicitis with localized peritonitis, without perforation or gangrene: Principal | ICD-10-CM

## 2024-07-01 DIAGNOSIS — R109 Unspecified abdominal pain: Secondary | ICD-10-CM | POA: Diagnosis present

## 2024-07-01 DIAGNOSIS — K3532 Acute appendicitis with perforation and localized peritonitis, without abscess: Principal | ICD-10-CM | POA: Insufficient documentation

## 2024-07-01 DIAGNOSIS — Z9049 Acquired absence of other specified parts of digestive tract: Secondary | ICD-10-CM

## 2024-07-01 HISTORY — PX: LAPAROSCOPIC APPENDECTOMY: SHX408

## 2024-07-01 LAB — CBC WITH DIFFERENTIAL/PLATELET
Abs Immature Granulocytes: 0.19 K/uL — ABNORMAL HIGH (ref 0.00–0.07)
Basophils Absolute: 0.1 K/uL (ref 0.0–0.1)
Basophils Relative: 0 %
Eosinophils Absolute: 0 K/uL (ref 0.0–0.5)
Eosinophils Relative: 0 %
HCT: 47.8 % (ref 39.0–52.0)
Hemoglobin: 16.4 g/dL (ref 13.0–17.0)
Immature Granulocytes: 1 %
Lymphocytes Relative: 4 %
Lymphs Abs: 0.9 K/uL (ref 0.7–4.0)
MCH: 29.4 pg (ref 26.0–34.0)
MCHC: 34.3 g/dL (ref 30.0–36.0)
MCV: 85.7 fL (ref 80.0–100.0)
Monocytes Absolute: 1.2 K/uL — ABNORMAL HIGH (ref 0.1–1.0)
Monocytes Relative: 5 %
Neutro Abs: 21.2 K/uL — ABNORMAL HIGH (ref 1.7–7.7)
Neutrophils Relative %: 90 %
Platelets: 205 K/uL (ref 150–400)
RBC: 5.58 MIL/uL (ref 4.22–5.81)
RDW: 14.9 % (ref 11.5–15.5)
WBC: 23.6 K/uL — ABNORMAL HIGH (ref 4.0–10.5)
nRBC: 0 % (ref 0.0–0.2)

## 2024-07-01 LAB — URINALYSIS, ROUTINE W REFLEX MICROSCOPIC
Bacteria, UA: NONE SEEN
Bilirubin Urine: NEGATIVE
Glucose, UA: NEGATIVE mg/dL
Hgb urine dipstick: NEGATIVE
Ketones, ur: 80 mg/dL — AB
Leukocytes,Ua: NEGATIVE
Nitrite: NEGATIVE
Protein, ur: 30 mg/dL — AB
Specific Gravity, Urine: 1.024 (ref 1.005–1.030)
pH: 7 (ref 5.0–8.0)

## 2024-07-01 LAB — COMPREHENSIVE METABOLIC PANEL WITH GFR
ALT: 17 U/L (ref 0–44)
AST: 17 U/L (ref 15–41)
Albumin: 4.1 g/dL (ref 3.5–5.0)
Alkaline Phosphatase: 55 U/L (ref 38–126)
Anion gap: 12 (ref 5–15)
BUN: 9 mg/dL (ref 8–23)
CO2: 23 mmol/L (ref 22–32)
Calcium: 9.2 mg/dL (ref 8.9–10.3)
Chloride: 101 mmol/L (ref 98–111)
Creatinine, Ser: 1.1 mg/dL (ref 0.61–1.24)
GFR, Estimated: >60 mL/min
Glucose, Bld: 138 mg/dL — ABNORMAL HIGH (ref 70–99)
Potassium: 4.6 mmol/L (ref 3.5–5.1)
Sodium: 135 mmol/L (ref 135–145)
Total Bilirubin: 1.8 mg/dL — ABNORMAL HIGH (ref 0.0–1.2)
Total Protein: 6.4 g/dL — ABNORMAL LOW (ref 6.5–8.1)

## 2024-07-01 LAB — LIPASE, BLOOD: Lipase: 19 U/L (ref 11–51)

## 2024-07-01 MED ORDER — FENTANYL CITRATE (PF) 100 MCG/2ML IJ SOLN
INTRAMUSCULAR | Status: AC
  Filled 2024-07-01: qty 2

## 2024-07-01 MED ORDER — ONDANSETRON HCL 4 MG/2ML IJ SOLN: 4.0000 mg | Freq: Four times a day (QID) | INTRAMUSCULAR | Status: DC | PRN

## 2024-07-01 MED ORDER — CHLORHEXIDINE GLUCONATE 0.12 % MT SOLN: 15.0000 mL | Freq: Once | OROMUCOSAL | Status: DC

## 2024-07-01 MED ORDER — ONDANSETRON 4 MG PO TBDP: 4.0000 mg | ORAL_TABLET | Freq: Four times a day (QID) | ORAL | Status: DC | PRN

## 2024-07-01 MED ORDER — ESMOLOL HCL 100 MG/10ML IV SOLN
INTRAVENOUS | Status: AC
  Filled 2024-07-01: qty 10

## 2024-07-01 MED ORDER — FENTANYL CITRATE (PF) 250 MCG/5ML IJ SOLN
INTRAMUSCULAR | Status: DC | PRN
  Administered 2024-07-01: 50 ug via INTRAVENOUS
  Administered 2024-07-01: 100 ug via INTRAVENOUS

## 2024-07-01 MED ORDER — ROCURONIUM BROMIDE 10 MG/ML (PF) SYRINGE
PREFILLED_SYRINGE | INTRAVENOUS | Status: DC | PRN
  Administered 2024-07-01: 30 mg via INTRAVENOUS

## 2024-07-01 MED ORDER — SUCCINYLCHOLINE CHLORIDE 200 MG/10ML IV SOSY
PREFILLED_SYRINGE | INTRAVENOUS | Status: DC | PRN
  Administered 2024-07-01: 120 mg via INTRAVENOUS

## 2024-07-01 MED ORDER — ACETAMINOPHEN 10 MG/ML IV SOLN
INTRAVENOUS | Status: AC
  Filled 2024-07-01: qty 100

## 2024-07-01 MED ORDER — LACTATED RINGERS IV SOLN: INTRAVENOUS | Status: DC | PRN

## 2024-07-01 MED ORDER — BUPIVACAINE-EPINEPHRINE 0.25% -1:200000 IJ SOLN
INTRAMUSCULAR | Status: DC | PRN
  Administered 2024-07-01: 8 mL

## 2024-07-01 MED ORDER — LACTATED RINGERS IV BOLUS: 1000.0000 mL | Freq: Once | INTRAVENOUS | Status: DC

## 2024-07-01 MED ORDER — BUPIVACAINE-EPINEPHRINE (PF) 0.25% -1:200000 IJ SOLN
INTRAMUSCULAR | Status: AC
  Filled 2024-07-01: qty 30

## 2024-07-01 MED ORDER — DEXAMETHASONE SOD PHOSPHATE PF 10 MG/ML IJ SOLN
INTRAMUSCULAR | Status: DC | PRN
  Administered 2024-07-01: 10 mg via INTRAVENOUS

## 2024-07-01 MED ORDER — IOHEXOL 350 MG/ML SOLN: 75.0000 mL | Freq: Once | INTRAVENOUS | Status: DC | PRN

## 2024-07-01 MED ORDER — SCOPOLAMINE 1 MG/3DAYS TD PT72
1.0000 | MEDICATED_PATCH | TRANSDERMAL | Status: DC
  Administered 2024-07-01: 1 mg via TRANSDERMAL

## 2024-07-01 MED ORDER — AMISULPRIDE (ANTIEMETIC) 5 MG/2ML IV SOLN
10.0000 mg | Freq: Once | INTRAVENOUS | Status: AC | PRN
  Administered 2024-07-01: 10 mg via INTRAVENOUS

## 2024-07-01 MED ORDER — ONDANSETRON HCL 4 MG/2ML IJ SOLN: 4.0000 mg | Freq: Once | INTRAMUSCULAR | Status: DC | PRN

## 2024-07-01 MED ORDER — PIPERACILLIN-TAZOBACTAM 3.375 G IVPB
3.3750 g | Freq: Three times a day (TID) | INTRAVENOUS | Status: DC
  Administered 2024-07-01 – 2024-07-03 (×5): 3.375 g via INTRAVENOUS
  Filled 2024-07-01 (×5): qty 50

## 2024-07-01 MED ORDER — MIDAZOLAM HCL (PF) 2 MG/2ML IJ SOLN
INTRAMUSCULAR | Status: DC | PRN
  Administered 2024-07-01: 2 mg via INTRAVENOUS

## 2024-07-01 MED ORDER — CHLORHEXIDINE GLUCONATE 0.12 % MT SOLN
OROMUCOSAL | Status: AC
  Filled 2024-07-01: qty 15

## 2024-07-01 MED ORDER — LACTATED RINGERS IV BOLUS
1000.0000 mL | Freq: Once | INTRAVENOUS | Status: AC
  Administered 2024-07-01: 1000 mL via INTRAVENOUS

## 2024-07-01 MED ORDER — LIDOCAINE 2% (20 MG/ML) 5 ML SYRINGE
INTRAMUSCULAR | Status: DC | PRN
  Administered 2024-07-01: 100 mg via INTRAVENOUS

## 2024-07-01 MED ORDER — MIDAZOLAM HCL 2 MG/2ML IJ SOLN
INTRAMUSCULAR | Status: AC
  Filled 2024-07-01: qty 2

## 2024-07-01 MED ORDER — HYDROCODONE-ACETAMINOPHEN 5-325 MG PO TABS
1.0000 | ORAL_TABLET | Freq: Four times a day (QID) | ORAL | Status: DC | PRN
  Administered 2024-07-02 (×3): 1 via ORAL
  Filled 2024-07-01 (×3): qty 1

## 2024-07-01 MED ORDER — PROPOFOL 10 MG/ML IV BOLUS
INTRAVENOUS | Status: AC
  Filled 2024-07-01: qty 20

## 2024-07-01 MED ORDER — SUGAMMADEX SODIUM 200 MG/2ML IV SOLN
INTRAVENOUS | Status: DC | PRN
  Administered 2024-07-01: 200 mg via INTRAVENOUS

## 2024-07-01 MED ORDER — ESMOLOL HCL 100 MG/10ML IV SOLN
INTRAVENOUS | Status: DC | PRN
  Administered 2024-07-01: 25 mg via INTRAVENOUS

## 2024-07-01 MED ORDER — METHOCARBAMOL 500 MG PO TABS
500.0000 mg | ORAL_TABLET | Freq: Three times a day (TID) | ORAL | Status: DC | PRN
  Administered 2024-07-02: 500 mg via ORAL
  Filled 2024-07-01: qty 1

## 2024-07-01 MED ORDER — FENTANYL CITRATE (PF) 100 MCG/2ML IJ SOLN: 25.0000 ug | INTRAMUSCULAR | Status: DC | PRN

## 2024-07-01 MED ORDER — ONDANSETRON HCL 4 MG/2ML IJ SOLN
4.0000 mg | Freq: Once | INTRAMUSCULAR | Status: AC
  Administered 2024-07-01: 4 mg via INTRAVENOUS
  Filled 2024-07-01: qty 2

## 2024-07-01 MED ORDER — METRONIDAZOLE 500 MG/100ML IV SOLN
500.0000 mg | Freq: Once | INTRAVENOUS | Status: AC
  Administered 2024-07-01: 500 mg via INTRAVENOUS
  Filled 2024-07-01: qty 100

## 2024-07-01 MED ORDER — IOHEXOL 350 MG/ML SOLN
75.0000 mL | Freq: Once | INTRAVENOUS | Status: AC | PRN
  Administered 2024-07-01: 75 mL via INTRAVENOUS

## 2024-07-01 MED ORDER — SODIUM CHLORIDE 0.9 % IV SOLN
2.0000 g | Freq: Once | INTRAVENOUS | Status: AC
  Administered 2024-07-01: 2 g via INTRAVENOUS
  Filled 2024-07-01: qty 20

## 2024-07-01 MED ORDER — ONDANSETRON HCL 4 MG/2ML IJ SOLN: 4.0000 mg | Freq: Once | INTRAMUSCULAR | Status: DC

## 2024-07-01 MED ORDER — SCOPOLAMINE 1 MG/3DAYS TD PT72
MEDICATED_PATCH | TRANSDERMAL | Status: AC
  Filled 2024-07-01: qty 1

## 2024-07-01 MED ORDER — ORAL CARE MOUTH RINSE: 15.0000 mL | OROMUCOSAL | Status: DC | PRN

## 2024-07-01 MED ORDER — ACETAMINOPHEN 10 MG/ML IV SOLN
INTRAVENOUS | Status: DC | PRN
  Administered 2024-07-01: 1000 mg via INTRAVENOUS

## 2024-07-01 MED ORDER — SODIUM CHLORIDE 0.9 % IR SOLN
Status: DC | PRN
  Administered 2024-07-01: 1000 mL

## 2024-07-01 MED ORDER — ONDANSETRON HCL 4 MG/2ML IJ SOLN
INTRAMUSCULAR | Status: DC | PRN
  Administered 2024-07-01: 4 mg via INTRAVENOUS

## 2024-07-01 MED ORDER — AMISULPRIDE (ANTIEMETIC) 5 MG/2ML IV SOLN
INTRAVENOUS | Status: AC
  Filled 2024-07-01: qty 4

## 2024-07-01 MED ORDER — HYDROMORPHONE HCL 1 MG/ML IJ SOLN
1.0000 mg | Freq: Once | INTRAMUSCULAR | Status: AC
  Administered 2024-07-01: 1 mg via INTRAVENOUS
  Filled 2024-07-01: qty 1

## 2024-07-01 MED ORDER — 0.9 % SODIUM CHLORIDE (POUR BTL) OPTIME
TOPICAL | Status: DC | PRN
  Administered 2024-07-01: 1000 mL

## 2024-07-01 MED ORDER — HYDROMORPHONE HCL 1 MG/ML IJ SOLN
1.0000 mg | INTRAMUSCULAR | Status: DC | PRN
  Administered 2024-07-01: 1 mg via INTRAVENOUS
  Filled 2024-07-01: qty 1

## 2024-07-01 MED ORDER — MORPHINE SULFATE (PF) 2 MG/ML IV SOLN
4.0000 mg | Freq: Once | INTRAVENOUS | Status: AC
  Administered 2024-07-01: 4 mg via INTRAVENOUS
  Filled 2024-07-01: qty 2

## 2024-07-01 MED ORDER — MORPHINE SULFATE (PF) 4 MG/ML IV SOLN: 4.0000 mg | INTRAVENOUS | Status: DC | PRN

## 2024-07-01 MED ORDER — ORAL CARE MOUTH RINSE: 15.0000 mL | Freq: Once | OROMUCOSAL | Status: DC

## 2024-07-01 MED ORDER — PROPOFOL 10 MG/ML IV BOLUS
INTRAVENOUS | Status: DC | PRN
  Administered 2024-07-01: 170 mg via INTRAVENOUS

## 2024-07-01 MED ORDER — LACTATED RINGERS IV SOLN: INTRAVENOUS | Status: DC

## 2024-07-01 NOTE — ED Notes (Signed)
 Pt agrees to get a urine sample when he can.

## 2024-07-01 NOTE — Anesthesia Preprocedure Evaluation (Signed)
"                                    Anesthesia Evaluation  Patient identified by MRN, date of birth, ID band Patient awake    Reviewed: Allergy & Precautions, NPO status , Patient's Chart, lab work & pertinent test results  Airway Mallampati: II  TM Distance: >3 FB Neck ROM: Full    Dental  (+) Teeth Intact, Dental Advisory Given   Pulmonary neg pulmonary ROS   Pulmonary exam normal breath sounds clear to auscultation       Cardiovascular negative cardio ROS Normal cardiovascular exam Rhythm:Regular Rate:Normal     Neuro/Psych  PSYCHIATRIC DISORDERS (OCD)      negative neurological ROS     GI/Hepatic Neg liver ROS,,,appendicitis   Endo/Other  Obesity   Renal/GU negative Renal ROS     Musculoskeletal negative musculoskeletal ROS (+)    Abdominal   Peds  Hematology negative hematology ROS (+)   Anesthesia Other Findings Day of surgery medications reviewed with the patient.  Reproductive/Obstetrics                              Anesthesia Physical Anesthesia Plan  ASA: 2 and emergent  Anesthesia Plan: General   Post-op Pain Management: Ofirmev  IV (intra-op)* and Toradol  IV (intra-op)*   Induction: Intravenous, Rapid sequence and Cricoid pressure planned  PONV Risk Score and Plan: 3 and Midazolam , Dexamethasone  and Ondansetron   Airway Management Planned: Oral ETT  Additional Equipment:   Intra-op Plan:   Post-operative Plan: Extubation in OR  Informed Consent: I have reviewed the patients History and Physical, chart, labs and discussed the procedure including the risks, benefits and alternatives for the proposed anesthesia with the patient or authorized representative who has indicated his/her understanding and acceptance.     Dental advisory given  Plan Discussed with: CRNA  Anesthesia Plan Comments:         Anesthesia Quick Evaluation  "

## 2024-07-01 NOTE — ED Notes (Signed)
 Report given to PACU RN, pt being transported to room #14 with Flagyl  per RN request.

## 2024-07-01 NOTE — Progress Notes (Signed)
 Dr. Corinne at bedside speaking with patient. Patient relayed that he was still nauseous after receiving medication. Scopolamine  patch placed per Dr. Tanda order. See MAR.

## 2024-07-01 NOTE — Op Note (Signed)
 07/01/2024 6:58 PM  PATIENT:  Bill Kennedy  64 y.o. male  PRE-OPERATIVE DIAGNOSIS:  appendicitis  POST-OPERATIVE DIAGNOSIS:  acute perforated appendicitis  PROCEDURE:  Procedures: APPENDECTOMY, LAPAROSCOPIC (N/A)  SURGEON:  Surgeons and Role:    DEWAINE Rubin Calamity, MD - Primary  ASSISTANTS: none   ANESTHESIA:   local and general  EBL:  minimal   BLOOD ADMINISTERED:none  DRAINS: none   LOCAL MEDICATIONS USED:  BUPIVICAINE   SPECIMEN:  Source of Specimen:  appendix  DISPOSITION OF SPECIMEN:  PATHOLOGY  COUNTS:  YES  TOURNIQUET:  * No tourniquets in log *  DICTATION: .Dragon Dictation  Complications: none  Counts: reported as correct x 2  Findings:  The patient had a acutely inflamed, perforated appendix  Specimen: Appendix  Indications for procedure:  The patient is a 64 year old male with a history of periumbilical pain localized in the right lower quadrant patient had a CT scan which revealed signs consistent with acute appendicitis the patient back in for laparoscopic appendectomy.  Details of the procedure:The patient was taken back to the operating room. The patient was placed in supine position with bilateral SCDs in place. The patient was prepped and draped in the usual sterile fashion.  After appropriate anitbiotics were confirmed, a time-out was confirmed and all facts were verified.    A pneumoperitoneum of 14 mmHg was obtained via a Veress needle technique in the left lower quadrant quadrant.  A 10 mm trocar and 5 mm camera then placed intra-abdominally there is no injury to any intra-abdominal organs a 5 mm supraumbilical port was placed and direct visualization as was a 5 mm port in the suprapubic area.   The appendix was identified and seen to be perforated and contained to the lateral abdominal wall.  The appendix was cleaned down to the appendiceal base. The mesoappendix was then incised and the appendiceal artery was cauterized.  The the  appendiceal base was clean.  A gold hemoclip was placed proximallyx2 and one distally and the appendix was transected between these 2. A retrieval bag was then placed into the abdomen and the specimen placed in the bag. The appendiceal stump was cauterized. We evacuate the fluid from the pelvis until the effluent was clear.  The appendix and retrieval  bag was then retrieved via the supraumbilical port. #1 Vicryl was used to reapproximate the fascia at the left lower quadrant port site x1. The skin was reapproximated all port sites 4-0 Monocryl subcuticular fashion. The skin was dressed with Dermabond.  The patient was awakened from general anesthesia was taken to recovery room in stable condition.     PLAN OF CARE: Admit for overnight observation  PATIENT DISPOSITION:  PACU - hemodynamically stable.   Delay start of Pharmacological VTE agent (>24hrs) due to surgical blood loss or risk of bleeding: not applicable

## 2024-07-01 NOTE — H&P (Signed)
 Bill Kennedy is an 64 y.o. male.   Chief Complaint: abdominal pain HPI: Pt is a 39M with abd pain x2 d.  Pt was seen in ED yesterday.  He return today d/t con't pain.  Pt with elev WBC.  Pt with CT scan and showing acute appendicitis with appendicoliths.  PT with hight WBC.  CT reviewed  Past Medical History:  Diagnosis Date   Hematuria    hospitalization in remote past, etiology unknown, and symptoms cleared   Insomnia    Joint pain    hx/o bone spur of right AC joint   Nearsightedness    wears glasses   OCD (obsessive compulsive disorder)    Dr. Vincente   Personal history of goiter    Thyroid disease     History reviewed. No pertinent surgical history.  Family History  Problem Relation Age of Onset   Diabetes Mother    Macular degeneration Mother    Hypertension Mother    Other Father        unknown   Anxiety disorder Sister    Anxiety disorder Brother    Heart disease Neg Hx    Cancer Neg Hx    Stroke Neg Hx    Hyperlipidemia Neg Hx    Social History:  reports that he has never smoked. He has never used smokeless tobacco. He reports current alcohol use of about 1.0 standard drink of alcohol per week. He reports that he does not use drugs.  Allergies: Allergies[1]  Medications Prior to Admission  Medication Sig Dispense Refill   ALPRAZolam (XANAX) 1 MG tablet      anastrozole (ARIMIDEX) 1 MG tablet      ARMOUR THYROID 60 MG tablet      B-D 3CC LUER-LOK SYR 22GX1 22G X 1 3 ML MISC USE WITH TESTOSTERONE  INJECTION     Cholecalciferol (VITAMIN D -3) 125 MCG (5000 UT) TABS Take by mouth.     Cobalamin Combinations (B12 FOLATE PO) Take by mouth.     Digestive Enzymes (SUPER ENZYMES) TABS Take by mouth.     HYDROcodone -acetaminophen  (NORCO/VICODIN) 5-325 MG tablet Take 1 tablet by mouth every 6 (six) hours as needed for moderate pain. 20 tablet 0   iodine-sodium iodide 2-2.4 % solution Apply topically once.     meloxicam  (MOBIC ) 15 MG tablet Take 0.5-1 tablets (7.5-15  mg total) by mouth daily as needed for pain. (Patient not taking: Reported on 07/25/2020) 30 tablet 6   Menatetrenone (VITAMIN K2) 100 MCG TABS Take by mouth.     methocarbamol  (ROBAXIN ) 500 MG tablet Take 1 tablet (500 mg total) by mouth every 8 (eight) hours as needed for muscle spasms. 8 tablet 0   Misc Natural Products (ADRENAL PO) Take by mouth. (Patient not taking: Reported on 07/25/2020)     Multiple Vitamin (MULTIVITAMIN) tablet Take 1 tablet by mouth daily.     selenium 50 MCG TABS tablet Take 50 mcg by mouth daily.     tamsulosin  (FLOMAX ) 0.4 MG CAPS capsule Take 1 capsule (0.4 mg total) by mouth daily after supper. 30 capsule 6   testosterone  cypionate (DEPOTESTOSTERONE CYPIONATE) 200 MG/ML injection SMARTSIG:0.7 Milliliter(s) IM Once a Week     Zolpidem Tartrate (AMBIEN PO) Take by mouth.        Results for orders placed or performed during the hospital encounter of 07/01/24 (from the past 48 hours)  Comprehensive metabolic panel     Status: Abnormal   Collection Time: 07/01/24  1:42 PM  Result Value Ref Range   Sodium 135 135 - 145 mmol/L   Potassium 4.6 3.5 - 5.1 mmol/L    Comment: Delta check noted    Chloride 101 98 - 111 mmol/L   CO2 23 22 - 32 mmol/L   Glucose, Bld 138 (H) 70 - 99 mg/dL    Comment: Glucose reference range applies only to samples taken after fasting for at least 8 hours.   BUN 9 8 - 23 mg/dL   Creatinine, Ser 8.89 0.61 - 1.24 mg/dL   Calcium 9.2 8.9 - 89.6 mg/dL   Total Protein 6.4 (L) 6.5 - 8.1 g/dL   Albumin 4.1 3.5 - 5.0 g/dL   AST 17 15 - 41 U/L   ALT 17 0 - 44 U/L   Alkaline Phosphatase 55 38 - 126 U/L   Total Bilirubin 1.8 (H) 0.0 - 1.2 mg/dL   GFR, Estimated >39 >39 mL/min    Comment: (NOTE) Calculated using the CKD-EPI Creatinine Equation (2021)    Anion gap 12 5 - 15    Comment: Performed at [facility], [address] [place], [place] [address]  CBC with Differential     Status: Abnormal   Collection Time: 07/01/24  1:42 PM   Result Value Ref Range   WBC 23.6 (H) 4.0 - 10.5 K/uL   RBC 5.58 4.22 - 5.81 MIL/uL   Hemoglobin 16.4 13.0 - 17.0 g/dL   HCT 52.1 60.9 - 47.9 %   MCV 85.7 80.0 - 100.0 fL   MCH 29.4 26.0 - 34.0 pg   MCHC 34.3 30.0 - 36.0 g/dL   RDW 85.0 88.4 - 84.4 %   Platelets 205 150 - 400 K/uL   nRBC 0.0 0.0 - 0.2 %   Neutrophils Relative % 90 %   Neutro Abs 21.2 (H) 1.7 - 7.7 K/uL   Lymphocytes Relative 4 %   Lymphs Abs 0.9 0.7 - 4.0 K/uL   Monocytes Relative 5 %   Monocytes Absolute 1.2 (H) 0.1 - 1.0 K/uL   Eosinophils Relative 0 %   Eosinophils Absolute 0.0 0.0 - 0.5 K/uL   Basophils Relative 0 %   Basophils Absolute 0.1 0.0 - 0.1 K/uL   Immature Granulocytes 1 %   Abs Immature Granulocytes 0.19 (H) 0.00 - 0.07 K/uL    Comment: Performed at [facility], [address] [place], [place] [address]  Lipase, blood     Status: None   Collection Time: 07/01/24  1:42 PM  Result Value Ref Range   Lipase 19 11 - 51 U/L    Comment: Performed at [facility], [address], [place], [place] [address]  Urinalysis, Routine w reflex microscopic -Urine, Clean Catch     Status: Abnormal   Collection Time: 07/01/24  3:10 PM  Result Value Ref Range   Color, Urine YELLOW YELLOW   APPearance HAZY (A) CLEAR   Specific Gravity, Urine 1.024 1.005 - 1.030   pH 7.0 5.0 - 8.0   Glucose, UA NEGATIVE NEGATIVE mg/dL   Hgb urine dipstick NEGATIVE NEGATIVE   Bilirubin Urine NEGATIVE NEGATIVE   Ketones, ur 80 (A) NEGATIVE mg/dL   Protein, ur 30 (A) NEGATIVE mg/dL   Nitrite NEGATIVE NEGATIVE   Leukocytes,Ua NEGATIVE NEGATIVE   RBC / HPF 6-10 0 - 5 RBC/hpf   WBC, UA 0-5 0 - 5 WBC/hpf   Bacteria, UA NONE SEEN NONE SEEN   Squamous Epithelial / HPF 0-5 0 - 5 /HPF  Mucus PRESENT     Comment: Performed at [facility], [address] [place], [place] [address]   CT ABDOMEN PELVIS W CONTRAST Addendum Date: 07/01/2024 ADDENDUM REPORT: 07/01/2024 16:59 ADDENDUM: Critical Value/emergent results  were called by telephone at the time of interpretation on 07/01/2024 at 4:57 pm to provider Dr. Elmer, who verbally acknowledged these results. Electronically Signed   By: Leita Birmingham M.D.   On: 07/01/2024 16:59   Result Date: 07/01/2024 CLINICAL DATA:  Abdominal pain, acute, nonlocalized. EXAM: CT ABDOMEN AND PELVIS WITH CONTRAST TECHNIQUE: Multidetector CT imaging of the abdomen and pelvis was performed using the standard protocol following bolus administration of intravenous contrast. RADIATION DOSE REDUCTION: This exam was performed according to the departmental dose-optimization program which includes automated exposure control, adjustment of the mA and/or kV according to patient size and/or use of iterative reconstruction technique. CONTRAST:  75mL OMNIPAQUE  IOHEXOL  350 MG/ML SOLN COMPARISON:  None Available. FINDINGS: Lower chest: Dependent atelectasis is noted at the lung bases. Hepatobiliary: Subcentimeter hypodensities are noted in the left lobe of the liver, likely cysts or hemangiomas. No biliary ductal dilatation. The gallbladder is without stones. Pancreas: Unremarkable. No pancreatic ductal dilatation or surrounding inflammatory changes. Spleen: Normal in size without focal abnormality. Adrenals/Urinary Tract: The adrenal glands are within normal limits. The kidneys enhance symmetrically. No renal calculus or hydronephrosis bilaterally. The bladder is unremarkable. Stomach/Bowel: The appendix is markedly distended measuring up to 1.5 cm with extensive surrounding inflammatory changes. Appendicoliths are noted. No free air or abscess is seen. There is thickening of the walls of the mid ascending colon, likely related to local inflammatory changes. No bowel obstruction or pneumatosis. The stomach is unremarkable. Vascular/Lymphatic: Aortic atherosclerosis. No enlarged abdominal or pelvic lymph nodes. Reproductive: Prostate is unremarkable. Other: Free fluid is noted in the anterior pararenal space on  the right extending into the retroperitoneum and right pericolic gutter in the region of inflammatory changes. Musculoskeletal: Degenerative changes are present in the thoracolumbar spine. 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 No acute osseous abnormality is seen. IMPRESSION: 1. Findings compatible with acute appendicitis. Extensive surrounding inflammatory changes a small amount of free fluid are present. No free air or abscess is seen. 2. Aortic atherosclerosis. Electronically Signed: By: Leita Birmingham M.D. On: 07/01/2024 16:55   DG Chest Port 1 View Result Date: 06/30/2024 EXAM: 1 VIEW(S) XRAY OF THE CHEST 06/30/2024 07:13:00 PM COMPARISON: 01/24/2011 CLINICAL HISTORY: Pain FINDINGS: LUNGS AND PLEURA: Low lung volume. Prominent hilar vasculature. No overt pulmonary edema. No focal pulmonary opacity. No pleural effusion. No pneumothorax. HEART AND MEDIASTINUM: No acute abnormality of the cardiac and mediastinal silhouettes. BONES AND SOFT TISSUES: No acute osseous abnormality. IMPRESSION: 1. Pulmonary venous congestion without pulmonary edema. Electronically signed by: Morgane Naveau MD MD 06/30/2024 07:19 PM EST RP Workstation: [id number]    Review of Systems  Constitutional:  Negative for chills and fever.  HENT:  Negative for ear discharge, hearing loss and sore throat.   Eyes:  Negative for discharge.  Respiratory:  Negative for cough and shortness of breath.   Cardiovascular:  Negative for chest pain and leg swelling.  Gastrointestinal:  Positive for abdominal pain. Negative for constipation, diarrhea, nausea and vomiting.  Musculoskeletal:  Negative for myalgias and neck pain.  Skin:  Negative for rash.  Allergic/Immunologic: Negative for environmental allergies.  Neurological:  Negative for dizziness and seizures.  Hematological:  Does not bruise/bleed easily.  Psychiatric/Behavioral:  Negative for suicidal ideas.  All other systems reviewed and are  negative.   Blood pressure 135/75, pulse (!) 110, temperature 98.3 F (36.8 C), temperature source Oral, resp. rate 18, height 5' 10 (1.778 m), weight 95.3 kg, SpO2 99%. Physical Exam Constitutional:      Appearance: He is well-developed.     Comments: Conversant No acute distress  HENT:     Head: Normocephalic and atraumatic.  Eyes:     General: Lids are normal. No scleral icterus.    Pupils: Pupils are equal, round, and reactive to light.     Comments: Pupils are equal round and reactive No lid lag Moist conjunctiva  Neck:     Thyroid: No thyromegaly.     Trachea: No tracheal tenderness.     Comments: No cervical lymphadenopathy Cardiovascular:     Rate and Rhythm: Normal rate and regular rhythm.     Heart sounds: No murmur heard. Pulmonary:     Effort: Pulmonary effort is normal.     Breath sounds: Normal breath sounds. No wheezing or rales.  Abdominal:     Tenderness: There is abdominal tenderness in the right lower quadrant.     Hernia: No hernia is present.  Musculoskeletal:     Cervical back: Normal range of motion and neck supple.  Skin:    General: Skin is warm.     Findings: No rash.     Nails: There is no clubbing.     Comments: Normal skin turgor  Neurological:     Mental Status: He is alert and oriented to person, place, and time.     Comments: Normal gait and station  Psychiatric:        Mood and Affect: Mood normal.        Thought Content: Thought content normal.        Judgment: Judgment normal.     Comments: Appropriate affect      Assessment/Plan 89M with acute appendicitis To OR for lap appy I discussed with the patient the risks benefits of the procedure to include but not limited to: Infection, bleeding, damage to surrounding structures, possible ileus, possible postoperative infection. Patient voiced understanding and wishes to proceed.   Lynda Leos, MD 07/01/2024, 6:00 PM       [1]  Allergies Allergen Reactions   Phenergan  [Promethazine]     hyper

## 2024-07-01 NOTE — Transfer of Care (Signed)
 Immediate Anesthesia Transfer of Care Note  Patient: Bill Kennedy  Procedure(s) Performed: APPENDECTOMY, LAPAROSCOPIC (Abdomen)  Patient Location: PACU  Anesthesia Type:General  Level of Consciousness: awake and sedated  Airway & Oxygen Therapy: Patient Spontanous Breathing  Post-op Assessment: Report given to RN and Post -op Vital signs reviewed and stable  Post vital signs: Reviewed and stable  Last Vitals:  Vitals Value Taken Time  BP 134/45 07/01/24 19:17  Temp    Pulse 122 07/01/24 19:18  Resp 15 07/01/24 19:18  SpO2 92 % 07/01/24 19:18  Vitals shown include unfiled device data.  Last Pain:  Vitals:   07/01/24 1710  TempSrc:   PainSc: 3          Complications: No notable events documented.

## 2024-07-01 NOTE — Anesthesia Postprocedure Evaluation (Signed)
"   Anesthesia Post Note  Patient: Bill Kennedy  Procedure(s) Performed: APPENDECTOMY, LAPAROSCOPIC (Abdomen)     Patient location during evaluation: PACU Anesthesia Type: General Level of consciousness: awake and alert Pain management: pain level controlled Vital Signs Assessment: post-procedure vital signs reviewed and stable Respiratory status: spontaneous breathing, nonlabored ventilation and respiratory function stable Cardiovascular status: blood pressure returned to baseline, stable and tachycardic Postop Assessment: no apparent nausea or vomiting Anesthetic complications: no   No notable events documented.  Last Vitals:  Vitals:   07/01/24 2000 07/01/24 2030  BP: (!) 145/80 (!) 142/75  Pulse: (!) 113 (!) 107  Resp: 15 15  Temp: 36.9 C 36.8 C  SpO2: 93% 93%    Last Pain:  Vitals:   07/01/24 2030  TempSrc: Oral  PainSc:                  [name]      "

## 2024-07-01 NOTE — ED Notes (Signed)
 Cannot urinate at

## 2024-07-01 NOTE — ED Provider Notes (Signed)
 Patient was seen by Dr. Doretha.  Please see her note.  Patient presented with abdominal pain.  CT scan was pending at the time of shift change  I was called by the radiologist.  The CT scan shows acute appendicitis.  I will consult with general surgery  Case discussed with Dr Rubin Randol Simmonds, MD 07/01/24 1724

## 2024-07-01 NOTE — ED Provider Triage Note (Signed)
 Emergency Medicine Provider Triage Evaluation Note  WASYL DORNFELD , a 64 y.o. male  was evaluated in triage.  Pt complains of abdominal pain, vomiting.  Review of Systems  Positive: Abdominal pain, N/V Negative: Fever, chills, cough, congestion  Physical Exam  BP (!) 126/104   Pulse (!) 119   Temp 99 F (37.2 C)   Resp 18   Ht 5' 10 (1.778 m)   Wt 95.3 kg   SpO2 99%   BMI 30.13 kg/m  Gen:   Awake, uncomfortable appearing Resp:  Normal effort  MSK:   Moves extremities without difficulty  Other:  Tachycardic  Medical Decision Making  Medically screening exam initiated at 1:52 PM.  Appropriate orders placed.  [employer] was informed that the remainder of the evaluation will be completed by another provider, this initial triage assessment does not replace that evaluation, and the importance of remaining in the ED until their evaluation is complete.  Labs and imaging ordered   Francis Ileana LOISE DEVONNA 07/01/24 1352

## 2024-07-01 NOTE — ED Provider Notes (Signed)
 " Bill Kennedy Provider Note   CSN: [id number] Arrival date & time: 07/01/24  1249     Patient presents with: Abdominal Pain   Bill Kennedy is a 64 y.o. male.   Patient is a 64 year old male with a history of OCD, thyroid disease and hematuria who is presenting today with complaint of ongoing abdominal pain.  He reports the pain started yesterday and he was seen in the emergency room where he was found to have an elevated white count but no significant abdominal pain.  He reports after the medications he received he felt better but after getting home the pain just returned and has now gotten worse.  He has had multiple episodes of vomiting and cannot remember when his last bowel movement was.  He is not sure he has been passing flatus.  He did try to take some leftover hydrocodone  he had at home but reports it is not helped.  He has not been able to eat but has been sipping a little bit on water.  No prior abdominal surgeries.  He does report that he had been on steroids recently because he had had the flu but denies any cough or congestion at this time.  He did urinate yesterday and a little bit today but denies any dysuria.  The history is provided by the patient.  Abdominal Pain      Prior to Admission medications  Medication Sig Start Date End Date Taking? Authorizing Provider  ALPRAZolam (XANAX) 1 MG tablet  08/24/18   [provider]  anastrozole (ARIMIDEX) 1 MG tablet  10/10/18   [provider]  ARMOUR THYROID 60 MG tablet  09/23/18   [provider]  B-D 3CC LUER-LOK SYR 22GX1 22G X 1 3 ML MISC USE WITH TESTOSTERONE  INJECTION 07/15/20   [provider]  Cholecalciferol (VITAMIN D -3) 125 MCG (5000 UT) TABS Take by mouth.    [provider]  Cobalamin Combinations (B12 FOLATE PO) Take by mouth.    [provider]  Digestive Enzymes (SUPER ENZYMES) TABS Take by mouth.     [provider]  HYDROcodone -acetaminophen  (NORCO/VICODIN) 5-325 MG tablet Take 1 tablet by mouth every 6 (six) hours as needed for moderate pain. 12/09/20   Hilts, Michael, MD  iodine-sodium iodide 2-2.4 % solution Apply topically once.    [provider]  meloxicam  (MOBIC ) 15 MG tablet Take 0.5-1 tablets (7.5-15 mg total) by mouth daily as needed for pain. Patient not taking: Reported on 07/25/2020 04/24/19   Hilts, Ozell, MD  Menatetrenone (VITAMIN K2) 100 MCG TABS Take by mouth.    [provider]  methocarbamol  (ROBAXIN ) 500 MG tablet Take 1 tablet (500 mg total) by mouth every 8 (eight) hours as needed for muscle spasms. 06/30/24   Patsey Lot, MD  Misc Natural Products (ADRENAL PO) Take by mouth. Patient not taking: Reported on 07/25/2020    [provider]  Multiple Vitamin (MULTIVITAMIN) tablet Take 1 tablet by mouth daily.    [provider]  selenium 50 MCG TABS tablet Take 50 mcg by mouth daily.    [provider]  tamsulosin  (FLOMAX ) 0.4 MG CAPS capsule Take 1 capsule (0.4 mg total) by mouth daily after supper. 05/08/20   Hilts, Ozell, MD  testosterone  cypionate (DEPOTESTOSTERONE CYPIONATE) 200 MG/ML injection SMARTSIG:0.7 Milliliter(s) IM Once a Week 07/04/20   [provider]  Zolpidem Tartrate (AMBIEN PO) Take by mouth.  [provider]    Allergies: Phenergan [promethazine]    Review of Systems  Gastrointestinal:  Positive for abdominal pain.    Updated Vital Signs BP (!) 126/104   Pulse (!) 119   Temp 99 F (37.2 C)   Resp 18   Ht 5' 10 (1.778 m)   Wt 95.3 kg   SpO2 99%   BMI 30.13 kg/m   Physical Exam Vitals and nursing note reviewed.  Constitutional:      General: He is in acute distress.     Appearance: He is well-developed.     Comments: Appears uncomfortable  HENT:     Head: Normocephalic and atraumatic.  Eyes:     Conjunctiva/sclera: Conjunctivae normal.     Pupils:  Pupils are equal, round, and reactive to light.  Cardiovascular:     Rate and Rhythm: Regular rhythm. Tachycardia present.     Heart sounds: No murmur heard. Pulmonary:     Effort: Pulmonary effort is normal. No respiratory distress.     Breath sounds: Normal breath sounds. No wheezing or rales.  Abdominal:     General: Bowel sounds are decreased. There is no distension.     Palpations: Abdomen is soft.     Tenderness: There is generalized abdominal tenderness. There is guarding. There is no rebound.  Musculoskeletal:        General: No tenderness. Normal range of motion.     Cervical back: Normal range of motion and neck supple.  Skin:    General: Skin is warm and dry.     Findings: No erythema or rash.  Neurological:     Mental Status: He is alert and oriented to person, place, and time. Mental status is at baseline.  Psychiatric:        Behavior: Behavior normal.     (all labs ordered are listed, but only abnormal results are displayed) Labs Reviewed  CBC WITH DIFFERENTIAL/PLATELET - Abnormal; Notable for the following components:      Result Value   WBC 23.6 (*)    Neutro Abs 21.2 (*)    Monocytes Absolute 1.2 (*)    Abs Immature Granulocytes 0.19 (*)    All other components within normal limits  COMPREHENSIVE METABOLIC PANEL WITH GFR  URINALYSIS, ROUTINE W REFLEX MICROSCOPIC  LIPASE, BLOOD    EKG: None  Radiology: DG Chest Port 1 View Result Date: 06/30/2024 EXAM: 1 VIEW(S) XRAY OF THE CHEST 06/30/2024 07:13:00 PM COMPARISON: 01/24/2011 CLINICAL HISTORY: Pain FINDINGS: LUNGS AND PLEURA: Low lung volume. Prominent hilar vasculature. No overt pulmonary edema. No focal pulmonary opacity. No pleural effusion. No pneumothorax. HEART AND MEDIASTINUM: No acute abnormality of the cardiac and mediastinal silhouettes. BONES AND SOFT TISSUES: No acute osseous abnormality. IMPRESSION: 1. Pulmonary venous congestion without pulmonary edema. Electronically signed by: Morgane Naveau  MD MD 06/30/2024 07:19 PM EST RP Workstation: [id number]     Procedures   Medications Ordered in the ED  HYDROmorphone  (DILAUDID ) injection 1 mg (has no administration in time range)  ondansetron  (ZOFRAN ) injection 4 mg (has no administration in time range)  lactated ringers  bolus 1,000 mL (has no administration in time range)  morphine  (PF) 2 MG/ML injection 4 mg (4 mg Intravenous Given 07/01/24 1415)  ondansetron  (ZOFRAN ) injection 4 mg (4 mg Intravenous Given 07/01/24 1412)  Medical Decision Making Risk Prescription drug management. Decision regarding hospitalization.   Pt with multiple medical problems and comorbidities and presenting today with a complaint that caries a high risk for morbidity and mortality.  Here today with ongoing abdominal pain.  Patient seen yesterday for the same but now worsening.  Yesterday was noted to have a white count of 21 but it was felt to be related to the patient recently being on steroids after having the flu.  Yesterday patient did not have abdominal pain however today patient's abdomen is tender.  Concern for perforation, abscess, renal stone, small bowel obstruction, hepatitis or cholecystitis.  Also possibility for pancreatitis.  Lower suspicion for acute cardiac or respiratory cause.  Patient given IV fluids, pain and nausea medication.  I interpreted patient's labs and CBC with slightly worsening leukocytosis today of 23 with normal hemoglobin,      Final diagnoses:  None    ED Discharge Orders     None          Doretha Folks, MD 07/09/24 1550  "

## 2024-07-01 NOTE — ED Triage Notes (Signed)
 Pt was seen yesterday for same. C/O abd pain that started yesterday/vomiting.

## 2024-07-01 NOTE — Anesthesia Procedure Notes (Signed)
 Procedure Name: Intubation Date/Time: 07/01/2024 6:29 PM  Performed by: Vaughn Zebedee HERO, CRNAPre-anesthesia Checklist: Patient identified, Emergency Drugs available, Suction available and Patient being monitored Patient Re-evaluated:Patient Re-evaluated prior to induction Oxygen Delivery Method: Circle system utilized Preoxygenation: Pre-oxygenation with 100% oxygen Induction Type: IV induction and Rapid sequence Laryngoscope Size: Miller and 2 Grade View: Grade II Tube type: Oral Tube size: 7.5 mm Number of attempts: 1 Airway Equipment and Method: Stylet Placement Confirmation: ETT inserted through vocal cords under direct vision, positive ETCO2 and breath sounds checked- equal and bilateral Secured at: 23 cm Tube secured with: Tape Dental Injury: Teeth and Oropharynx as per pre-operative assessment

## 2024-07-02 ENCOUNTER — Encounter (HOSPITAL_COMMUNITY): Payer: Self-pay | Admitting: General Surgery

## 2024-07-02 LAB — CBC
HCT: 44.4 % (ref 39.0–52.0)
Hemoglobin: 15.4 g/dL (ref 13.0–17.0)
MCH: 29.7 pg (ref 26.0–34.0)
MCHC: 34.7 g/dL (ref 30.0–36.0)
MCV: 85.5 fL (ref 80.0–100.0)
Platelets: 176 K/uL (ref 150–400)
RBC: 5.19 MIL/uL (ref 4.22–5.81)
RDW: 15.3 % (ref 11.5–15.5)
WBC: 22.8 K/uL — ABNORMAL HIGH (ref 4.0–10.5)
nRBC: 0 % (ref 0.0–0.2)

## 2024-07-02 MED ORDER — ACETAMINOPHEN 500 MG PO TABS
1000.0000 mg | ORAL_TABLET | Freq: Four times a day (QID) | ORAL | Status: DC
  Administered 2024-07-02 – 2024-07-03 (×2): 1000 mg via ORAL
  Filled 2024-07-02 (×3): qty 2

## 2024-07-02 MED ORDER — KETOROLAC TROMETHAMINE 30 MG/ML IJ SOLN
30.0000 mg | Freq: Four times a day (QID) | INTRAMUSCULAR | Status: DC
  Administered 2024-07-02 – 2024-07-03 (×3): 30 mg via INTRAVENOUS
  Filled 2024-07-02 (×3): qty 1

## 2024-07-02 MED ORDER — OXYCODONE HCL 5 MG PO TABS
5.0000 mg | ORAL_TABLET | ORAL | Status: DC | PRN
  Administered 2024-07-02: 10 mg via ORAL
  Filled 2024-07-02: qty 2

## 2024-07-02 NOTE — Progress Notes (Signed)
 "  Progress Note  1 Day Post-Op  Subjective: Patient reports soreness of the abdomen. Has only had water since surgery. Denies n/v. Has not had BM or flatulence since surgery. Endorses mobility since surgery.   ROS  All negative with the exception of above.  Objective: Vital signs in last 24 hours: Temp:  [97.6 F (36.4 C)-100.7 F (38.2 C)] 97.6 F (36.4 C) (01/12 0843) Pulse Rate:  [94-124] 95 (01/12 0843) Resp:  [15-20] 18 (01/12 0843) BP: (117-151)/(70-104) 117/70 (01/12 0843) SpO2:  [93 %-99 %] 94 % (01/12 0843) Weight:  [95.3 kg] 95.3 kg (01/11 1325)    Intake/Output from previous day: 01/11 0701 - 01/12 0700 In: 2380.4 [P.O.:580; I.V.:800; IV Piggyback:1000.4] Out: 1210 [Urine:1200; Blood:10] Intake/Output this shift: Total I/O In: -  Out: 250 [Urine:250]  PE: General: Pleasant male who is laying in bed in NAD. HEENT: Head is normocephalic, atraumatic.  Heart: HR normal during encounter. Lungs: Respiratory effort nonlabored on room air.  Abd: Soft with some distention. Appropriate generalized tenderness to palpation. Laparoscopic incisions with dermabond C/D/I. No rebound tenderness or guarding.  MS: Able to move all 4 extremities.  Skin: Warm and dry.  Psych: A&Ox3 with an appropriate affect.    Lab Results:  Recent Labs    07/01/24 1342 07/02/24 0502  WBC 23.6* 22.8*  HGB 16.4 15.4  HCT 47.8 44.4  PLT 205 176   BMET Recent Labs    06/30/24 1856 07/01/24 1342  NA 138 135  K 3.5 4.6  CL 102 101  CO2 18* 23  GLUCOSE 114* 138*  BUN 13 9  CREATININE 0.93 1.10  CALCIUM 9.8 9.2   PT/INR No results for input(s): LABPROT, INR in the last 72 hours. CMP     Component Value Date/Time   NA 135 07/01/2024 1342   K 4.6 07/01/2024 1342   CL 101 07/01/2024 1342   CO2 23 07/01/2024 1342   GLUCOSE 138 (H) 07/01/2024 1342   BUN 9 07/01/2024 1342   CREATININE 1.10 07/01/2024 1342   CREATININE 0.89 07/25/2020 0922   CALCIUM 9.2 07/01/2024 1342    PROT 6.4 (L) 07/01/2024 1342   ALBUMIN 4.1 07/01/2024 1342   AST 17 07/01/2024 1342   ALT 17 07/01/2024 1342   ALKPHOS 55 07/01/2024 1342   BILITOT 1.8 (H) 07/01/2024 1342   GFRNONAA >60 07/01/2024 1342   Lipase     Component Value Date/Time   LIPASE 19 07/01/2024 1342       Studies/Results: CT ABDOMEN PELVIS W CONTRAST Addendum Date: 07/01/2024 ADDENDUM REPORT: 07/01/2024 16:59 ADDENDUM: Critical Value/emergent results were called by telephone at the time of interpretation on 07/01/2024 at 4:57 pm to provider Dr. Elmer, who verbally acknowledged these results. Electronically Signed   By: Leita Birmingham M.D.   On: 07/01/2024 16:59   Result Date: 07/01/2024 CLINICAL DATA:  Abdominal pain, acute, nonlocalized. EXAM: CT ABDOMEN AND PELVIS WITH CONTRAST TECHNIQUE: Multidetector CT imaging of the abdomen and pelvis was performed using the standard protocol following bolus administration of intravenous contrast. RADIATION DOSE REDUCTION: This exam was performed according to the departmental dose-optimization program which includes automated exposure control, adjustment of the mA and/or kV according to patient size and/or use of iterative reconstruction technique. CONTRAST:  75mL OMNIPAQUE  IOHEXOL  350 MG/ML SOLN COMPARISON:  None Available. FINDINGS: Lower chest: Dependent atelectasis is noted at the lung bases. Hepatobiliary: Subcentimeter hypodensities are noted in the left lobe of the liver, likely cysts or hemangiomas. No biliary ductal dilatation. The  gallbladder is without stones. Pancreas: Unremarkable. No pancreatic ductal dilatation or surrounding inflammatory changes. Spleen: Normal in size without focal abnormality. Adrenals/Urinary Tract: The adrenal glands are within normal limits. The kidneys enhance symmetrically. No renal calculus or hydronephrosis bilaterally. The bladder is unremarkable. Stomach/Bowel: The appendix is markedly distended measuring up to 1.5 cm with extensive surrounding  inflammatory changes. Appendicoliths are noted. No free air or abscess is seen. There is thickening of the walls of the mid ascending colon, likely related to local inflammatory changes. No bowel obstruction or pneumatosis. The stomach is unremarkable. Vascular/Lymphatic: Aortic atherosclerosis. No enlarged abdominal or pelvic lymph nodes. Reproductive: Prostate is unremarkable. Other: Free fluid is noted in the anterior pararenal space on the right extending into the retroperitoneum and right pericolic gutter in the region of inflammatory changes. Musculoskeletal: Degenerative changes are present in the thoracolumbar spine. 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 No acute osseous abnormality is seen. IMPRESSION: 1. Findings compatible with acute appendicitis. Extensive surrounding inflammatory changes a small amount of free fluid are present. No free air or abscess is seen. 2. Aortic atherosclerosis. Electronically Signed: By: Leita Birmingham M.D. On: 07/01/2024 16:55   DG Chest Port 1 View Result Date: 06/30/2024 EXAM: 1 VIEW(S) XRAY OF THE CHEST 06/30/2024 07:13:00 PM COMPARISON: 01/24/2011 CLINICAL HISTORY: Pain FINDINGS: LUNGS AND PLEURA: Low lung volume. Prominent hilar vasculature. No overt pulmonary edema. No focal pulmonary opacity. No pleural effusion. No pneumothorax. HEART AND MEDIASTINUM: No acute abnormality of the cardiac and mediastinal silhouettes. BONES AND SOFT TISSUES: No acute osseous abnormality. IMPRESSION: 1. Pulmonary venous congestion without pulmonary edema. Electronically signed by: Morgane Naveau MD MD 06/30/2024 07:19 PM EST RP Workstation: [id number]    Anti-infectives: Anti-infectives (From admission, onward)    Start     Dose/Rate Route Frequency Ordered Stop   07/01/24 2200  piperacillin -tazobactam (ZOSYN ) IVPB 3.375 g        3.375 g 12.5 mL/hr over 240 Minutes Intravenous Every 8 hours 07/01/24 2035 07/06/24 2159   07/01/24 1700   cefTRIAXone  (ROCEPHIN ) 2 g in sodium chloride  0.9 % 100 mL IVPB       Placed in And Linked Group   2 g 200 mL/hr over 30 Minutes Intravenous  Once 07/01/24 1658 07/01/24 2026   07/01/24 1700  metroNIDAZOLE  (FLAGYL ) IVPB 500 mg       Placed in And Linked Group   500 mg 100 mL/hr over 60 Minutes Intravenous  Once 07/01/24 1658 07/01/24 1838        Assessment/Plan POD1: S/P Laparoscopic appendectomy 1/11 with Dr. Rubin.  -Afebrile. -WBC 22.8 from 23.6 -Exam without significant concerns. -Having soreness but manageable. No BM or flatulence since surgery. Denies n/v. Has only had water since surgery.  -If tolerates more PO intake, hopeful that patient can be discharged this afternoon. -Will discharge with course of PO antibiotics.  -Discussed post-operative expectations and restrictions in great detail. Does not need a work physicist, medical. Outpatient follow up being arranged. Patient aware to call to confirm follow up.  FEN: CLD; LR VTE: SCDs ID: Completed rocephin /flagyl ; Currently on Zosyn     LOS: 0 days   I reviewed op notes, specialist notes, nursing notes, last 24 h vitals and pain scores, last 48 h intake and output, last 24 h labs and trends, and last 24 h imaging results.  Marjorie Carlyon Favre, [facility] 07/02/2024, 9:13 AM Please see Amion for pager number during day hours 7:00am-4:30pm  "

## 2024-07-02 NOTE — Discharge Instructions (Signed)
 You were hospitalized for because your gallbladder was inflamed. We got your gallbladder taken out Thank you for allowing us  to be part of your care.   Your appointment is with Dr. Azadegan on 12/19 at 10:15 am. Please go to this appointment  Please note these changes made to your medications:  *Please START taking:  We are starting you on jardiance  10 mg. Please start taking this tomorrow(12/14) and take only one tablet a day   We are also giving you some medication that you can take over the next couple of days for pain. Please take this for severe pain. Make sure you are still having regular bowel movements with it.   Please call our clinic if you have any questions or concerns, we may be able to help and keep you from a long and expensive emergency room wait. Our clinic and after hours phone number is [phone], the best time to call is Monday through Friday 9 am to 4 pm but there is always someone available 24/7 if you have an emergency. If you need medication refills please notify your pharmacy one week in advance and they will send us  a request.      LAPAROSCOPIC SURGERY: POST OP INSTRUCTIONS Always review your discharge instruction sheet given to you by the facility where your surgery was performed. IF YOU HAVE DISABILITY OR FAMILY LEAVE FORMS, YOU MUST BRING THEM TO THE OFFICE FOR PROCESSING.   DO NOT GIVE THEM TO YOUR DOCTOR.  PAIN CONTROL  First take acetaminophen  (Tylenol ) AND/or ibuprofen (Advil) to control your pain after surgery.  Follow directions on package.  Taking acetaminophen  (Tylenol ) and/or ibuprofen (Advil) regularly after surgery will help to control your pain and lower the amount of prescription pain medication you may need.  You should not take more than 3,000 mg (3 grams) of acetaminophen  (Tylenol ) in 24 hours.  You should not take ibuprofen (Advil), aleve, motrin, naprosyn or other NSAIDS if you have a history of stomach ulcers or chronic kidney disease.  A  prescription for pain medication may be given to you upon discharge.  Take your pain medication as prescribed, if you still have uncontrolled pain after taking acetaminophen  (Tylenol ) or ibuprofen (Advil). Use ice packs to help control pain. If you need a refill on your pain medication, please contact your pharmacy.  They will contact our office to request authorization. Prescriptions will not be filled after 5pm or on week-ends.  HOME MEDICATIONS Take your usually prescribed medications unless otherwise directed.  DIET You should follow a light diet the first few days after arrival home.  Be sure to include lots of fluids daily. Avoid fatty, fried foods.   CONSTIPATION It is common to experience some constipation after surgery and if you are taking pain medication.  Increasing fluid intake and taking a stool softener (such as Colace) will usually help or prevent this problem from occurring.  A mild laxative (Milk of Magnesia or Miralax ) should be taken according to package instructions if there are no bowel movements after 48 hours.  WOUND/INCISION CARE Most patients will experience some swelling and bruising in the area of the incisions.  Ice packs will help.  Swelling and bruising can take several days to resolve.  Unless discharge instructions indicate otherwise, follow guidelines below  STERI-STRIPS - you may remove your outer bandages 48 hours after surgery, and you may shower at that time.  You have steri-strips (small skin tapes) in place directly over the incision.  These strips should  be left on the skin for 7-10 days.   DERMABOND/SKIN GLUE - you may shower in 24 hours.  The glue will flake off over the next 2-3 weeks. Any sutures or staples will be removed at the office during your follow-up visit.  ACTIVITIES You may resume regular (light) daily activities beginning the next day--such as daily self-care, walking, climbing stairs--gradually increasing activities as tolerated.  You may  have sexual intercourse when it is comfortable.  Refrain from any heavy lifting or straining until approved by your doctor. You may drive when you are no longer taking prescription pain medication, you can comfortably wear a seatbelt, and you can safely maneuver your car and apply brakes.  FOLLOW-UP You should see your doctor in the office for a follow-up appointment approximately 2-3 weeks after your surgery.  You should have been given your post-op/follow-up appointment when your surgery was scheduled.  If you did not receive a post-op/follow-up appointment, make sure that you call for this appointment within a day or two after you arrive home to insure a convenient appointment time.  OTHER INSTRUCTIONS  WHEN TO CALL YOUR DOCTOR: Fever over 101.0 Inability to urinate Continued bleeding from incision. Increased pain, redness, or drainage from the incision. Increasing abdominal pain  The clinic staff is available to answer your questions during regular business hours.  Please dont hesitate to call and ask to speak to one of the nurses for clinical concerns.  If you have a medical emergency, go to the nearest emergency room or call 911.  A surgeon from [facility] is always on call at the hospital. [address], [address], [place], [place]  [address] ? [address] [address], [place], [place]   [address] [phone] ? [phone] ? FAX [phone] Web site: [email or web address]

## 2024-07-02 NOTE — Plan of Care (Signed)
 Uneventful PM shift. Pt in log of pain when he arrived on the floor. PRN dilaudid  given with relief. After this patient c/o 4/10 pain which was treated with PRN norco per order. No c/o nausea overnight.    Problem: Education: Goal: Knowledge of the prescribed therapeutic regimen will improve Outcome: Progressing   Problem: Bowel/Gastric: Goal: Gastrointestinal status for postoperative course will improve Outcome: Progressing   Problem: Cardiac: Goal: Ability to maintain an adequate cardiac output Outcome: Progressing Goal: Will show no evidence of cardiac arrhythmias Outcome: Progressing   Problem: Nutritional: Goal: Will attain and maintain optimal nutritional status Outcome: Progressing   Problem: Neurological: Goal: Will regain or maintain usual level of consciousness Outcome: Progressing   Problem: Clinical Measurements: Goal: Ability to maintain clinical measurements within normal limits Outcome: Progressing Goal: Postoperative complications will be avoided or minimized Outcome: Progressing   Problem: Respiratory: Goal: Will regain and/or maintain adequate ventilation Outcome: Progressing Goal: Respiratory status will improve Outcome: Progressing   Problem: Skin Integrity: Goal: Demonstrates signs of wound healing without infection Outcome: Progressing   Problem: Urinary Elimination: Goal: Will remain free from infection Outcome: Progressing Goal: Ability to achieve and maintain adequate urine output Outcome: Progressing   Problem: Education: Goal: Knowledge of General Education information will improve Description: Including pain rating scale, medication(s)/side effects and non-pharmacologic comfort measures Outcome: Progressing   Problem: Health Behavior/Discharge Planning: Goal: Ability to manage health-related needs will improve Outcome: Progressing   Problem: Clinical Measurements: Goal: Ability to maintain clinical measurements within normal limits  will improve Outcome: Progressing Goal: Will remain free from infection Outcome: Progressing Goal: Diagnostic test results will improve Outcome: Progressing Goal: Respiratory complications will improve Outcome: Progressing Goal: Cardiovascular complication will be avoided Outcome: Progressing   Problem: Activity: Goal: Risk for activity intolerance will decrease Outcome: Progressing   Problem: Nutrition: Goal: Adequate nutrition will be maintained Outcome: Progressing   Problem: Coping: Goal: Level of anxiety will decrease Outcome: Progressing   Problem: Elimination: Goal: Will not experience complications related to bowel motility Outcome: Progressing Goal: Will not experience complications related to urinary retention Outcome: Progressing   Problem: Pain Managment: Goal: General experience of comfort will improve and/or be controlled Outcome: Progressing   Problem: Safety: Goal: Ability to remain free from injury will improve Outcome: Progressing   Problem: Skin Integrity: Goal: Risk for impaired skin integrity will decrease Outcome: Progressing

## 2024-07-02 NOTE — TOC CM/SW Note (Signed)
 Transition of Care [facility]) - Inpatient Brief Assessment   Patient Details  Name: Bill Kennedy MRN: [id number] Date of Birth: Jul 29, 1960  Transition of Care [facility]) CM/SW Contact:    Roxie KANDICE Stain, RN Phone Number: 07/02/2024, 10:06 AM   Clinical Narrative: # Patient had lap appendectomy. Patient received iv antibiotics and on clear liquid diet.  ICM (Inpatient Care Management) will continue to follow, no needs anticipated at this time.  Transition of Care Asessment: Insurance and Status: Insurance coverage has been reviewed Patient has primary care physician: Yes Home environment has been reviewed: Safe to discharge home Prior level of function:: Independent Prior/Current Home Services: No current home services Social Drivers of Health Review: SDOH reviewed no interventions necessary Readmission risk has been reviewed: Yes Transition of care needs: no transition of care needs at this time

## 2024-07-02 NOTE — Plan of Care (Signed)

## 2024-07-03 ENCOUNTER — Other Ambulatory Visit (HOSPITAL_COMMUNITY): Payer: Self-pay

## 2024-07-03 LAB — CBC
HCT: 39.3 % (ref 39.0–52.0)
Hemoglobin: 13.2 g/dL (ref 13.0–17.0)
MCH: 29.3 pg (ref 26.0–34.0)
MCHC: 33.6 g/dL (ref 30.0–36.0)
MCV: 87.3 fL (ref 80.0–100.0)
Platelets: 165 K/uL (ref 150–400)
RBC: 4.5 MIL/uL (ref 4.22–5.81)
RDW: 15 % (ref 11.5–15.5)
WBC: 16 K/uL — ABNORMAL HIGH (ref 4.0–10.5)
nRBC: 0 % (ref 0.0–0.2)

## 2024-07-03 LAB — SURGICAL PATHOLOGY

## 2024-07-03 MED ORDER — ACETAMINOPHEN 500 MG PO TABS: 500.0000 mg | ORAL_TABLET | Freq: Four times a day (QID) | ORAL | Status: AC | PRN

## 2024-07-03 MED ORDER — OXYCODONE HCL 5 MG PO TABS
5.0000 mg | ORAL_TABLET | Freq: Four times a day (QID) | ORAL | 0 refills | Status: AC | PRN
  Filled 2024-07-03: qty 20, 5d supply, fill #0

## 2024-07-03 MED ORDER — AMOXICILLIN-POT CLAVULANATE 875-125 MG PO TABS
1.0000 | ORAL_TABLET | Freq: Two times a day (BID) | ORAL | 0 refills | Status: AC
  Filled 2024-07-03: qty 8, 4d supply, fill #0

## 2024-07-03 NOTE — Plan of Care (Signed)

## 2024-07-03 NOTE — Progress Notes (Signed)
 "  Progress Note  2 Days Post-Op  Subjective: WBC trending down. He reports that he feels much better today. Tolerating PO. Has been out of bed.   ROS  All negative with the exception of above.  Objective: Vital signs in last 24 hours: Temp:  [97.5 F (36.4 C)-98.2 F (36.8 C)] 98.2 F (36.8 C) (01/13 0641) Pulse Rate:  [74-97] 74 (01/13 0641) Resp:  [16-19] 18 (01/13 0641) BP: (101-120)/(57-71) 101/57 (01/13 0641) SpO2:  [94 %-96 %] 95 % (01/13 0641)    Intake/Output from previous day: 01/12 0701 - 01/13 0700 In: 240 [P.O.:240] Out: 500 [Urine:500] Intake/Output this shift: No intake/output data recorded.  PE: General: Pleasant male who is laying in bed in NAD. HEENT: Head is normocephalic, atraumatic.  Heart: HR normal during encounter. Lungs: Respiratory effort nonlabored on room air.  Abd: Soft with some distention. Appropriate generalized tenderness to palpation. Laparoscopic incisions with dermabond C/D/I. No rebound tenderness or guarding.  MS: Able to move all 4 extremities.  Skin: Warm and dry.  Psych: A&Ox3 with an appropriate affect.    Lab Results:  Recent Labs    07/02/24 0502 07/03/24 0454  WBC 22.8* 16.0*  HGB 15.4 13.2  HCT 44.4 39.3  PLT 176 165   BMET Recent Labs    06/30/24 1856 07/01/24 1342  NA 138 135  K 3.5 4.6  CL 102 101  CO2 18* 23  GLUCOSE 114* 138*  BUN 13 9  CREATININE 0.93 1.10  CALCIUM 9.8 9.2   PT/INR No results for input(s): LABPROT, INR in the last 72 hours. CMP     Component Value Date/Time   NA 135 07/01/2024 1342   K 4.6 07/01/2024 1342   CL 101 07/01/2024 1342   CO2 23 07/01/2024 1342   GLUCOSE 138 (H) 07/01/2024 1342   BUN 9 07/01/2024 1342   CREATININE 1.10 07/01/2024 1342   CREATININE 0.89 07/25/2020 0922   CALCIUM 9.2 07/01/2024 1342   PROT 6.4 (L) 07/01/2024 1342   ALBUMIN 4.1 07/01/2024 1342   AST 17 07/01/2024 1342   ALT 17 07/01/2024 1342   ALKPHOS 55 07/01/2024 1342   BILITOT 1.8 (H)  07/01/2024 1342   GFRNONAA >60 07/01/2024 1342   Lipase     Component Value Date/Time   LIPASE 19 07/01/2024 1342       Studies/Results: CT ABDOMEN PELVIS W CONTRAST Addendum Date: 07/01/2024 ADDENDUM REPORT: 07/01/2024 16:59 ADDENDUM: Critical Value/emergent results were called by telephone at the time of interpretation on 07/01/2024 at 4:57 pm to provider Dr. Elmer, who verbally acknowledged these results. Electronically Signed   By: Leita Birmingham M.D.   On: 07/01/2024 16:59   Result Date: 07/01/2024 CLINICAL DATA:  Abdominal pain, acute, nonlocalized. EXAM: CT ABDOMEN AND PELVIS WITH CONTRAST TECHNIQUE: Multidetector CT imaging of the abdomen and pelvis was performed using the standard protocol following bolus administration of intravenous contrast. RADIATION DOSE REDUCTION: This exam was performed according to the departmental dose-optimization program which includes automated exposure control, adjustment of the mA and/or kV according to patient size and/or use of iterative reconstruction technique. CONTRAST:  75mL OMNIPAQUE  IOHEXOL  350 MG/ML SOLN COMPARISON:  None Available. FINDINGS: Lower chest: Dependent atelectasis is noted at the lung bases. Hepatobiliary: Subcentimeter hypodensities are noted in the left lobe of the liver, likely cysts or hemangiomas. No biliary ductal dilatation. The gallbladder is without stones. Pancreas: Unremarkable. No pancreatic ductal dilatation or surrounding inflammatory changes. Spleen: Normal in size without focal abnormality. Adrenals/Urinary Tract: The  adrenal glands are within normal limits. The kidneys enhance symmetrically. No renal calculus or hydronephrosis bilaterally. The bladder is unremarkable. Stomach/Bowel: The appendix is markedly distended measuring up to 1.5 cm with extensive surrounding inflammatory changes. Appendicoliths are noted. No free air or abscess is seen. There is thickening of the walls of the mid ascending colon, likely related to  local inflammatory changes. No bowel obstruction or pneumatosis. The stomach is unremarkable. Vascular/Lymphatic: Aortic atherosclerosis. No enlarged abdominal or pelvic lymph nodes. Reproductive: Prostate is unremarkable. Other: Free fluid is noted in the anterior pararenal space on the right extending into the retroperitoneum and right pericolic gutter in the region of inflammatory changes. Musculoskeletal: Degenerative changes are present in the thoracolumbar spine. 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 No acute osseous abnormality is seen. IMPRESSION: 1. Findings compatible with acute appendicitis. Extensive surrounding inflammatory changes a small amount of free fluid are present. No free air or abscess is seen. 2. Aortic atherosclerosis. Electronically Signed: By: Leita Birmingham M.D. On: 07/01/2024 16:55    Anti-infectives: Anti-infectives (From admission, onward)    Start     Dose/Rate Route Frequency Ordered Stop   07/01/24 2200  piperacillin -tazobactam (ZOSYN ) IVPB 3.375 g        3.375 g 12.5 mL/hr over 240 Minutes Intravenous Every 8 hours 07/01/24 2035 07/06/24 2159   07/01/24 1700  cefTRIAXone  (ROCEPHIN ) 2 g in sodium chloride  0.9 % 100 mL IVPB       Placed in And Linked Group   2 g 200 mL/hr over 30 Minutes Intravenous  Once 07/01/24 1658 07/01/24 2026   07/01/24 1700  metroNIDAZOLE  (FLAGYL ) IVPB 500 mg       Placed in And Linked Group   500 mg 100 mL/hr over 60 Minutes Intravenous  Once 07/01/24 1658 07/01/24 1838        Assessment/Plan POD2: S/P Laparoscopic appendectomy 1/11 with Dr. Rubin.  -Afebrile. -WBC 16 from 22 -Plan for discharge to home today  FEN: Regular VTE: SCDs ID: Completed rocephin /flagyl , will DC on Augmentin    LOS: 0 days   I reviewed op notes, specialist notes, nursing notes, last 24 h vitals and pain scores, last 48 h intake and output, last 24 h labs and trends, and last 24 h imaging results.  Cordella DELENA Idler, MD  [facility] 07/03/2024, 7:20 AM Please see Amion for pager number during day hours 7:00am-4:30pm  "

## 2024-07-04 NOTE — Discharge Summary (Signed)
 Physician Discharge Summary  Patient ID: Bill Kennedy MRN: [id number] DOB/AGE: 12/27/1960 64 y.o.  Admit date: 07/01/2024 Discharge date: 07/04/2024  Admission Diagnoses:  Discharge Diagnoses:  Principal Problem:   S/P appendectomy   Discharged Condition: stable  Hospital Course: 64 y/o M who presented to the hospital with abdominal pain. His workup was concerning for appendicitis. He was taken to the OR, where intra-op findings were notable for a contained perforation. He remained inpatient for post-operative care. His diet was advanced in a stepwise fashion. On POD 2 he discharged to home in stable condition. At the time of discharge he was tolerating PO, ambulating without assistance, and his pain was relatively well controlled. He will discharge on PO antibiotics.   Discharge Exam: Blood pressure 106/63, pulse 76, temperature 98.4 F (36.9 C), temperature source Oral, resp. rate 18, height 5' 10 (1.778 m), weight 95.3 kg, SpO2 96%. Gen: male, NAD Abd: soft, non-distended, port sites clean and dry with dermabond intact  Disposition: Discharge disposition: 01-Home or Self Care        Allergies as of 07/03/2024       Reactions   Meperidine Hcl Other (See Comments)   Unknown    Phenergan [promethazine] Other (See Comments)   Hyper, anxious, sleepy        Medication List     STOP taking these medications    HYDROcodone -acetaminophen  5-325 MG tablet Commonly known as: NORCO/VICODIN       TAKE these medications    acetaminophen  500 MG tablet Commonly known as: TYLENOL  Take 1 tablet (500 mg total) by mouth every 6 (six) hours as needed for mild pain (pain score 1-3).   ALPRAZolam 1 MG tablet Commonly known as: XANAX Take 0.5-1 mg by mouth 3 (three) times daily as needed for anxiety.   amoxicillin -clavulanate 875-125 MG tablet Commonly known as: AUGMENTIN  Take 1 tablet by mouth 2 (two) times daily for 4 days.   Armour Thyroid 60 MG tablet Generic  drug: thyroid   liothyronine 5 MCG tablet Commonly known as: CYTOMEL Take 5 mcg by mouth in the morning and at bedtime.   liothyronine 25 MCG tablet Commonly known as: CYTOMEL Take 25 mcg by mouth in the morning and at bedtime.   methocarbamol  500 MG tablet Commonly known as: ROBAXIN  Take 1 tablet (500 mg total) by mouth every 8 (eight) hours as needed for muscle spasms.   multivitamin with minerals tablet Take 1 tablet by mouth daily.   oxyCODONE  5 MG immediate release tablet Commonly known as: Oxy IR/ROXICODONE  Take 1 tablet (5 mg total) by mouth every 6 (six) hours as needed for moderate pain (pain score 4-6) or severe pain (pain score 7-10).   testosterone  cypionate 200 MG/ML injection Commonly known as: DEPOTESTOSTERONE CYPIONATE SMARTSIG:0.7 Milliliter(s) IM Once a Week   tiZANidine 4 MG tablet Commonly known as: ZANAFLEX Take 4 mg by mouth daily as needed for muscle spasms.   Vitamin D2 10 MCG (400 UNIT) Tabs Take 400 Units by mouth every other day.   zolpidem 10 MG tablet Commonly known as: AMBIEN Take 10 mg by mouth at bedtime as needed for sleep.        Follow-up Information     Maczis, Puja Gosai, PA-C. Go on 07/31/2024.   Specialty: General Surgery Why: At 9:00AM, Arrive 30 mins prior to scheduled appointment time, Please bring your ID and insurance cards with you to appointment. Contact information: [address] [facility] [place] [address] [phone]  Signed: Cordella DELENA Idler 07/04/2024, 11:03 AM
